# Patient Record
Sex: Male | Born: 2005 | Race: White | Hispanic: No | Marital: Single | Smoking: Never smoker
Health system: Southern US, Community
[De-identification: ages and names within clinical notes are randomized; demographics above are authoritative.]

## PROBLEM LIST (undated history)

## (undated) DIAGNOSIS — R51 Headache: Secondary | ICD-10-CM

## (undated) DIAGNOSIS — E669 Obesity, unspecified: Secondary | ICD-10-CM

## (undated) DIAGNOSIS — R519 Headache, unspecified: Secondary | ICD-10-CM

## (undated) DIAGNOSIS — R198 Other specified symptoms and signs involving the digestive system and abdomen: Secondary | ICD-10-CM

---
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
## 2023-02-04 ENCOUNTER — Encounter: Payer: Self-pay | Admitting: Adult Health

## 2023-02-04 ENCOUNTER — Ambulatory Visit (INDEPENDENT_AMBULATORY_CARE_PROVIDER_SITE_OTHER): Payer: Self-pay | Admitting: Adult Health

## 2023-02-04 DIAGNOSIS — F411 Generalized anxiety disorder: Secondary | ICD-10-CM

## 2023-02-04 DIAGNOSIS — F902 Attention-deficit hyperactivity disorder, combined type: Secondary | ICD-10-CM

## 2023-02-04 DIAGNOSIS — F341 Dysthymic disorder: Secondary | ICD-10-CM

## 2023-02-04 MED ORDER — AMPHETAMINE-DEXTROAMPHET ER 25 MG PO CP24: 25.0000 mg | ORAL_CAPSULE | ORAL | 0 refills | Status: DC

## 2023-02-04 MED ORDER — AMPHETAMINE-DEXTROAMPHET ER 25 MG PO CP24: 25.0000 mg | ORAL_CAPSULE | Freq: Every day | ORAL | 0 refills | Status: DC

## 2023-02-04 NOTE — Addendum Note (Signed)
Addended by: Aloha Gell on: 02/04/2023 05:17 PM   Modules accepted: Level of Service

## 2023-02-04 NOTE — Progress Notes (Signed)
Eugene Boyd [id number] 07-Dec-2006 17 y.o.  Virtual Visit via Telephone Note  I connected with pt on 02/04/23 at  5:00 PM EST by telephone and verified that I am speaking with the correct person using two identifiers.   I discussed the limitations, risks, security and privacy concerns of performing an evaluation and management service by telephone and the availability of in person appointments. I also discussed with the patient that there may be a patient responsible charge related to this service. The patient expressed understanding and agreed to proceed.   I discussed the assessment and treatment plan with the patient. The patient was provided an opportunity to ask questions and all were answered. The patient agreed with the plan and demonstrated an understanding of the instructions.   The patient was advised to call back or seek an in-person evaluation if the symptoms worsen or if the condition fails to improve as anticipated.  I provided 15 minutes of non-face-to-face time during this encounter.  The patient was located at home.  The provider was located at [place].   Aloha Gell, NP   Subjective:   Patient ID:  Eugene Boyd is a 17 y.o. (DOB [birth year]) male.  Chief Complaint: No chief complaint on file.   HPI Eugene Boyd presents for follow-up of MDD, GAD and ADHD.  Describes mood today as "ok". Pleasant. Mood symptoms - denies depression. Reports anxiety and irritability - "trying to keep it out". Reports some worry, rumination and over thinking.  Mood is consistent. Stating "I'm doing pretty good". Feels like the Adderall XR 75m works well for him. Improved interest and motivation. Taking medications as prescribed.   Energy levels stable. Active, has a regular exercise routine.  Enjoys some usual interests and activities. Dating. Has a girlfriend. Lives with father. Mother and father divorced. Spending time with family. Appetite adequate. Weight -  185 from 210 pounds - 75". Sleep has improved. Averages 8 hours.  Focus and concentration stable with Adderall. Completing tasks. Managing aspects of household. High school graduate.  Working at [employer] Denies SI or HI.  Denies AH or VH. Denies any self harm.    Review of Systems:  Review of Systems  Musculoskeletal:  Negative for gait problem.  Neurological:  Negative for tremors.  Psychiatric/Behavioral:         Please refer to HPI    Medications: I have reviewed the patient's current medications.  Current Outpatient Medications  Medication Sig Dispense Refill   amphetamine-dextroamphetamine (ADDERALL XR) 25 MG 24 hr capsule Take 1 capsule by mouth daily after breakfast. 30 capsule 0   amphetamine-dextroamphetamine (ADDERALL XR) 25 MG 24 hr capsule Take 1 capsule by mouth every morning. 30 capsule 0   amphetamine-dextroamphetamine (ADDERALL XR) 25 MG 24 hr capsule Take 1 capsule by mouth every morning. 30 capsule 0   No current facility-administered medications for this visit.    Medication Side Effects: None  Allergies:  Allergies  Allergen Reactions   Amoxicillin Rash    Past Medical History:  Diagnosis Date   Gastrointestinal complaints    Headache    Obesity     Family History  Problem Relation Age of Onset   GER disease Mother    Asthma Mother    Allergies Mother    Colon polyps Mother    Hiatal hernia Mother    Colitis Maternal Grandmother    Celiac disease Maternal Grandmother    Epilepsy Father     Social History  Socioeconomic History   Marital status: Single    Spouse name: Not on file   Number of children: Not on file   Years of education: Not on file   Highest education level: Not on file  Occupational History   Not on file  Tobacco Use   Smoking status: Never   Smokeless tobacco: Never  Vaping Use   Vaping Use: Never used  Substance and Sexual Activity   Alcohol use: No   Drug use: No   Sexual activity: Never  Other Topics  Concern   Not on file  Social History Narrative    Pt is in grade 10th [employer], virtual. Lives at home with mother and sister.   Social Determinants of Health   Financial Resource Strain: Not on file  Food Insecurity: Not on file  Transportation Needs: Not on file  Physical Activity: Not on file  Stress: Not on file  Social Connections: Not on file  Intimate Partner Violence: Not on file    Past Medical History, Surgical history, Social history, and Family history were reviewed and updated as appropriate.   Please see review of systems for further details on the patient's review from today.   Objective:   Physical Exam:  There were no vitals taken for this visit.  Physical Exam  Lab Review:     Component Value Date/Time   NA 140 09/15/2017 1212   NA 141 10/30/2014 1123   K 4.5 09/15/2017 1212   K 4.0 10/30/2014 1123   CL 103 09/15/2017 1212   CL 106 10/30/2014 1123   CO2 27 09/15/2017 1212   CO2 28 (H) 10/30/2014 1123   GLUCOSE 84 09/15/2017 1212   GLUCOSE 83 10/30/2014 1123   BUN 10 09/15/2017 1212   BUN 11 10/30/2014 1123   CREATININE 0.61 09/15/2017 1212   CALCIUM 10.2 09/15/2017 1212   CALCIUM 9.4 10/30/2014 1123   PROT 7.3 09/15/2017 1212   PROT 7.9 10/30/2014 1123   ALBUMIN 4.0 10/30/2014 1123   AST 16 09/15/2017 1212   AST 28 10/30/2014 1123   ALT 21 09/15/2017 1212   ALT 33 10/30/2014 1123   ALKPHOS 251 (H) 10/30/2014 1123   BILITOT 0.4 09/15/2017 1212   BILITOT 0.2 10/30/2014 1123       Component Value Date/Time   WBC 6.8 09/15/2017 1212   RBC 4.90 09/15/2017 1212   HGB 13.6 09/15/2017 1212   HCT 40.6 09/15/2017 1212   PLT 355 09/15/2017 1212   MCV 82.9 09/15/2017 1212   MCH 27.8 09/15/2017 1212   MCHC 33.5 09/15/2017 1212   RDW 13.1 09/15/2017 1212   LYMPHSABS 3,448 09/15/2017 1212   EOSABS 143 09/15/2017 1212   BASOSABS 41 09/15/2017 1212    No results found for: "POCLITH", "LITHIUM"   No results found for: "PHENYTOIN",  "PHENOBARB", "VALPROATE", "CBMZ"   .res Assessment: Plan:     Plan:  PDMP reviewed   Adderall XR 83m daily - will call in 3 months for next set of refills.   Set up with therapist - CLanetta Inch RTC 6 months  Patient advised to contact office with any questions, adverse effects, or acute worsening in signs and symptoms.  Discussed potential benefits, risks, and side effects of stimulants with patient to include increased heart rate, palpitations, insomnia, increased anxiety, increased irritability, or decreased appetite.  Instructed patient to contact office if experiencing any significant tolerability issues.  There are no diagnoses linked to this encounter.  Please see After Visit  Summary for patient specific instructions.  Future Appointments  Date Time Provider [place]  02/04/2023  5:00 PM Deslyn Cavenaugh, Berdie Ogren, NP CP-CP None    No orders of the defined types were placed in this encounter.     -------------------------------

## 2023-02-05 ENCOUNTER — Telehealth: Payer: Self-pay | Admitting: Adult Health

## 2023-02-05 NOTE — Telephone Encounter (Signed)
Patient returned call to confirm prescription be sent to [facility] on [facility] in [place].

## 2023-02-05 NOTE — Telephone Encounter (Signed)
Pt's mom originally called  at 3:40p for refill of Adderall.  They don't have '25mg'$ , they have '30mg'$  at [facility] on [facility] [facility] in [place].  Since Mom isn't on DPR, I contacted the pt to confirm he wanted script sent.  No upcoming appts scheduled.

## 2023-02-08 NOTE — Telephone Encounter (Signed)
Filled his usual dose on 2/23.

## 2023-06-08 ENCOUNTER — Other Ambulatory Visit: Payer: Self-pay | Admitting: Adult Health

## 2023-06-08 ENCOUNTER — Telehealth: Payer: Self-pay | Admitting: Adult Health

## 2023-06-08 DIAGNOSIS — F902 Attention-deficit hyperactivity disorder, combined type: Secondary | ICD-10-CM

## 2023-06-08 MED ORDER — AMPHETAMINE-DEXTROAMPHET ER 25 MG PO CP24: 25.0000 mg | ORAL_CAPSULE | ORAL | 0 refills | Status: DC

## 2023-06-08 NOTE — Telephone Encounter (Signed)
Mom called and said that Eugene Boyd is out of his adderall xr 25 mg.  The pharmacy it needs to be sent to is the [facility] [address] in [place]. There other pharmacy closed down. He is out and is going through withdrawal. Next appt in august

## 2023-06-08 NOTE — Telephone Encounter (Signed)
Script sent  

## 2023-08-03 ENCOUNTER — Telehealth: Payer: Self-pay | Admitting: Adult Health

## 2023-08-03 ENCOUNTER — Other Ambulatory Visit: Payer: Self-pay

## 2023-08-03 DIAGNOSIS — F902 Attention-deficit hyperactivity disorder, combined type: Secondary | ICD-10-CM

## 2023-08-03 MED ORDER — AMPHETAMINE-DEXTROAMPHET ER 25 MG PO CP24: 25.0000 mg | ORAL_CAPSULE | ORAL | 0 refills | Status: DC

## 2023-08-03 NOTE — Telephone Encounter (Signed)
Pended.

## 2023-08-03 NOTE — Telephone Encounter (Signed)
Pt's mother called in at 2:42pm.  He will need a RF on Adderall XR 25mg  before his next appt - 8/28/ Please send in RF to: [id number] - [place], [place] - [address] AT [facility] OF [address]

## 2023-08-11 ENCOUNTER — Ambulatory Visit (INDEPENDENT_AMBULATORY_CARE_PROVIDER_SITE_OTHER): Payer: Self-pay | Admitting: Adult Health

## 2023-08-11 DIAGNOSIS — Z0389 Encounter for observation for other suspected diseases and conditions ruled out: Secondary | ICD-10-CM

## 2023-08-11 NOTE — Progress Notes (Deleted)
Eugene Boyd [id number] 11-12-2006 17 y.o.  Subjective:   Patient ID:  Eugene Boyd is a 17 y.o. (DOB 05-Mar-2006) male.  Chief Complaint: No chief complaint on file.   HPI DAESHON KEAN presents to the office today for follow-up of MDD, GAD and ADHD.  Describes mood today as "ok". Pleasant. Mood symptoms - denies depression. Reports anxiety and irritability - "trying to keep it out". Reports some worry, rumination and over thinking.  Mood is consistent. Stating "I'm doing pretty good". Feels like the Adderall XR 25mg  works well for him. Improved interest and motivation. Taking medications as prescribed.   Energy levels stable. Active, has a regular exercise routine.  Enjoys some usual interests and activities. Dating. Has a girlfriend. Lives with father. Mother and father divorced. Spending time with family. Appetite adequate. Weight - 185 from 210 pounds - 75". Sleep has improved. Averages 8 hours.  Focus and concentration stable with Adderall. Completing tasks. Managing aspects of household. High school graduate.  Working at [employer]. Denies SI or HI.  Denies AH or VH. Denies any self harm.      Review of Systems:  Review of Systems  Medications: {medication reviewed/display:3041432}  Current Outpatient Medications  Medication Sig Dispense Refill   amphetamine-dextroamphetamine (ADDERALL XR) 25 MG 24 hr capsule Take 1 capsule by mouth daily after breakfast. 30 capsule 0   amphetamine-dextroamphetamine (ADDERALL XR) 25 MG 24 hr capsule Take 1 capsule by mouth every morning. 30 capsule 0   amphetamine-dextroamphetamine (ADDERALL XR) 25 MG 24 hr capsule Take 1 capsule by mouth every morning. 30 capsule 0   No current facility-administered medications for this visit.    Medication Side Effects: {Medication Side Effects (Optional):21014029}  Allergies:  Allergies  Allergen Reactions   Amoxicillin Rash    Past Medical History:  Diagnosis Date   Gastrointestinal  complaints    Headache    Obesity     Past Medical History, Surgical history, Social history, and Family history were reviewed and updated as appropriate.   Please see review of systems for further details on the patient's review from today.   Objective:   Physical Exam:  There were no vitals taken for this visit.  Physical Exam  Lab Review:     Component Value Date/Time   NA 140 09/15/2017 1212   NA 141 10/30/2014 1123   K 4.5 09/15/2017 1212   K 4.0 10/30/2014 1123   CL 103 09/15/2017 1212   CL 106 10/30/2014 1123   CO2 27 09/15/2017 1212   CO2 28 (H) 10/30/2014 1123   GLUCOSE 84 09/15/2017 1212   GLUCOSE 83 10/30/2014 1123   BUN 10 09/15/2017 1212   BUN 11 10/30/2014 1123   CREATININE 0.61 09/15/2017 1212   CALCIUM 10.2 09/15/2017 1212   CALCIUM 9.4 10/30/2014 1123   PROT 7.3 09/15/2017 1212   PROT 7.9 10/30/2014 1123   ALBUMIN 4.0 10/30/2014 1123   AST 16 09/15/2017 1212   AST 28 10/30/2014 1123   ALT 21 09/15/2017 1212   ALT 33 10/30/2014 1123   ALKPHOS 251 (H) 10/30/2014 1123   BILITOT 0.4 09/15/2017 1212   BILITOT 0.2 10/30/2014 1123       Component Value Date/Time   WBC 6.8 09/15/2017 1212   RBC 4.90 09/15/2017 1212   HGB 13.6 09/15/2017 1212   HCT 40.6 09/15/2017 1212   PLT 355 09/15/2017 1212   MCV 82.9 09/15/2017 1212   MCH 27.8 09/15/2017 1212   MCHC 33.5 09/15/2017  1212   RDW 13.1 09/15/2017 1212   LYMPHSABS 3,448 09/15/2017 1212   EOSABS 143 09/15/2017 1212   BASOSABS 41 09/15/2017 1212    No results found for: "POCLITH", "LITHIUM"   No results found for: "PHENYTOIN", "PHENOBARB", "VALPROATE", "CBMZ"   .res Assessment: Plan:    Plan:  PDMP reviewed   Adderall XR 25mg  daily - will call in 3 months for next set of refills.   Set up with therapist - Elio Forget  RTC 6 months  Patient advised to contact office with any questions, adverse effects, or acute worsening in signs and symptoms.  Discussed potential benefits, risks,  and side effects of stimulants with patient to include increased heart rate, palpitations, insomnia, increased anxiety, increased irritability, or decreased appetite.  Instructed patient to contact office if experiencing any significant tolerability issues. There are no diagnoses linked to this encounter.   Please see After Visit Summary for patient specific instructions.  Future Appointments  Date Time Provider Department Center  08/11/2023  5:20 PM Maycee Blasco, Thereasa Solo, NP CP-CP None    No orders of the defined types were placed in this encounter.   -------------------------------

## 2023-08-11 NOTE — Progress Notes (Signed)
Patient no show appointment. ? ?

## 2023-09-15 ENCOUNTER — Telehealth: Payer: Self-pay | Admitting: Adult Health

## 2023-09-15 NOTE — Telephone Encounter (Signed)
Mom, Liborio Nixon, called at 10:58 and LM to request refill of Eugene Boyd's Adderall.  We will call her back for appt.  Missed the last appt. He has taken his last pill.  She did not leave pharmacy info.

## 2023-09-15 NOTE — Telephone Encounter (Signed)
Patient has not been compliant with FU. Needs a scheduled appt and we will send in Rx to get to appt.

## 2023-09-16 NOTE — Telephone Encounter (Signed)
LM with Mom to call for appt.  Almira Coaster ok'ed it can be a telephone or virtual appt.

## 2023-09-17 ENCOUNTER — Telehealth: Payer: Self-pay | Admitting: Adult Health

## 2023-09-17 NOTE — Telephone Encounter (Signed)
Mom called and made an appt for 10/7. He needs [facility]all sent to [facility] [address] in [place]

## 2023-09-19 NOTE — Telephone Encounter (Signed)
Has appt 10/7.

## 2023-09-20 ENCOUNTER — Other Ambulatory Visit: Payer: Self-pay

## 2023-09-20 ENCOUNTER — Telehealth: Payer: Self-pay | Admitting: Adult Health

## 2023-09-20 DIAGNOSIS — F902 Attention-deficit hyperactivity disorder, combined type: Secondary | ICD-10-CM

## 2023-09-20 MED ORDER — AMPHETAMINE-DEXTROAMPHET ER 25 MG PO CP24: 25.0000 mg | ORAL_CAPSULE | ORAL | 0 refills | Status: DC

## 2023-09-20 NOTE — Telephone Encounter (Signed)
Pended to Dr. Jennelle Human in Gina's absence.  Due to past due for FU and a no show only pended enough to get to appt.

## 2023-09-20 NOTE — Telephone Encounter (Signed)
Pt was scheduled to have today with Almira Coaster, but since she's out sick, pt is requesting to have Adderall XR 25 mg sent to  [facility] #[id number] [place], [place] - [address] AT [facility] OF [address] [facility] [address] [place], [place] [address] Phone: [phone]  Fax: [phone]   Pt has been out of meds since 10/2 and is starting to have withdrawals.  Pls send asap.  Next appt 10/11

## 2023-09-24 ENCOUNTER — Telehealth: Payer: BC Managed Care – PPO | Admitting: Adult Health

## 2023-09-24 ENCOUNTER — Encounter: Payer: Self-pay | Admitting: Adult Health

## 2023-09-24 DIAGNOSIS — F411 Generalized anxiety disorder: Secondary | ICD-10-CM | POA: Diagnosis not present

## 2023-09-24 DIAGNOSIS — F341 Dysthymic disorder: Secondary | ICD-10-CM | POA: Diagnosis not present

## 2023-09-24 DIAGNOSIS — F902 Attention-deficit hyperactivity disorder, combined type: Secondary | ICD-10-CM

## 2023-09-24 MED ORDER — AMPHETAMINE-DEXTROAMPHET ER 25 MG PO CP24: 25.0000 mg | ORAL_CAPSULE | ORAL | 0 refills | Status: DC

## 2023-09-24 MED ORDER — AMPHETAMINE-DEXTROAMPHET ER 25 MG PO CP24: 25.0000 mg | ORAL_CAPSULE | Freq: Every day | ORAL | 0 refills | Status: DC

## 2023-09-24 NOTE — Progress Notes (Signed)
Eugene Boyd [id number] Apr 09, 2006 17 y.o.  Virtual Visit via Video Note  I connected with pt @ on 09/24/23 at  4:00 PM EDT by a video enabled telemedicine application and verified that I am speaking with the correct person using two identifiers.   I discussed the limitations of evaluation and management by telemedicine and the availability of in person appointments. The patient expressed understanding and agreed to proceed.  I discussed the assessment and treatment plan with the patient. The patient was provided an opportunity to ask questions and all were answered. The patient agreed with the plan and demonstrated an understanding of the instructions.   The patient was advised to call back or seek an in-person evaluation if the symptoms worsen or if the condition fails to improve as anticipated.  I provided 15 minutes of non-face-to-face time during this encounter.  The patient was located at home.  The provider was located at [facility].   Dorothyann Gibbs, NP   Subjective:   Patient ID:  Eugene Boyd is a 17 y.o. (DOB September 15, 2006) male.  Chief Complaint: No chief complaint on file.   HPI Eugene Boyd presents for follow-up of MDD, GAD and ADHD.  Describes mood today as "ok". Pleasant. Mood symptoms - reports some situational depression. Reports anxiety and irritability - "more just life". Denies panic attacks. Reports some worry, rumination and over thinking. Mood is variable. Stating "I think I'm doing alright for now". Feels like the Adderall XR 25mg  works well for him. Improved interest and motivation. Taking medications as prescribed.   Energy levels stable. Active, does not have a regular exercise routine.  Enjoys some usual interests and activities. Single. Lives with father. Mother and father divorced. Spending time with family. Appetite adequate. Weight - 185 pounds - 75". Sleep has improved. Averages 5 to 6 hours during work days and longer on days off.   Focus and concentration stable with Adderall. Completing tasks. Managing aspects of household. High school graduate. Working at [employer]. Denies SI or HI.  Denies AH or VH. Denies any self harm. Denies substance use.   Review of Systems:  Review of Systems  Musculoskeletal:  Negative for gait problem.  Neurological:  Negative for tremors.  Psychiatric/Behavioral:         Please refer to HPI    Medications: I have reviewed the patient's current medications.  Current Outpatient Medications  Medication Sig Dispense Refill   amphetamine-dextroamphetamine (ADDERALL XR) 25 MG 24 hr capsule Take 1 capsule by mouth daily after breakfast. 30 capsule 0   [START ON 10/22/2023] amphetamine-dextroamphetamine (ADDERALL XR) 25 MG 24 hr capsule Take 1 capsule by mouth every morning. 30 capsule 0   [START ON 11/19/2023] amphetamine-dextroamphetamine (ADDERALL XR) 25 MG 24 hr capsule Take 1 capsule by mouth every morning. 30 capsule 0   No current facility-administered medications for this visit.    Medication Side Effects: None  Allergies:  Allergies  Allergen Reactions   Amoxicillin Rash    Past Medical History:  Diagnosis Date   Gastrointestinal complaints    Headache    Obesity     Family History  Problem Relation Age of Onset   GER disease Mother    Asthma Mother    Allergies Mother    Colon polyps Mother    Hiatal hernia Mother    Colitis Maternal Grandmother    Celiac disease Maternal Grandmother    Epilepsy Father     Social History   Socioeconomic History  Marital status: Single    Spouse name: Not on file   Number of children: Not on file   Years of education: Not on file   Highest education level: Not on file  Occupational History   Not on file  Tobacco Use   Smoking status: Never   Smokeless tobacco: Never  Vaping Use   Vaping status: Never Used  Substance and Sexual Activity   Alcohol use: No   Drug use: No   Sexual activity: Never  Other Topics  Concern   Not on file  Social History Narrative    Pt is in grade 10th [employer], virtual. Lives at home with mother and sister.   Social Determinants of Health   Financial Resource Strain: Not on file  Food Insecurity: Not on file  Transportation Needs: Not on file  Physical Activity: Not on file  Stress: Not on file  Social Connections: Not on file  Intimate Partner Violence: Not on file    Past Medical History, Surgical history, Social history, and Family history were reviewed and updated as appropriate.   Please see review of systems for further details on the patient's review from today.   Objective:   Physical Exam:  There were no vitals taken for this visit.  Physical Exam Constitutional:      General: He is not in acute distress. Musculoskeletal:        General: No deformity.  Neurological:     Mental Status: He is alert and oriented to person, place, and time.     Coordination: Coordination normal.  Psychiatric:        Attention and Perception: Attention and perception normal. He does not perceive auditory or visual hallucinations.        Mood and Affect: Affect is not labile, blunt, angry or inappropriate.        Speech: Speech normal.        Behavior: Behavior normal.        Thought Content: Thought content normal. Thought content is not paranoid or delusional. Thought content does not include homicidal or suicidal ideation. Thought content does not include homicidal or suicidal plan.        Cognition and Memory: Cognition and memory normal.        Judgment: Judgment normal.     Comments: Insight intact     Lab Review:     Component Value Date/Time   NA 140 09/15/2017 1212   NA 141 10/30/2014 1123   K 4.5 09/15/2017 1212   K 4.0 10/30/2014 1123   CL 103 09/15/2017 1212   CL 106 10/30/2014 1123   CO2 27 09/15/2017 1212   CO2 28 (H) 10/30/2014 1123   GLUCOSE 84 09/15/2017 1212   GLUCOSE 83 10/30/2014 1123   BUN 10 09/15/2017 1212   BUN 11  10/30/2014 1123   CREATININE 0.61 09/15/2017 1212   CALCIUM 10.2 09/15/2017 1212   CALCIUM 9.4 10/30/2014 1123   PROT 7.3 09/15/2017 1212   PROT 7.9 10/30/2014 1123   ALBUMIN 4.0 10/30/2014 1123   AST 16 09/15/2017 1212   AST 28 10/30/2014 1123   ALT 21 09/15/2017 1212   ALT 33 10/30/2014 1123   ALKPHOS 251 (H) 10/30/2014 1123   BILITOT 0.4 09/15/2017 1212   BILITOT 0.2 10/30/2014 1123       Component Value Date/Time   WBC 6.8 09/15/2017 1212   RBC 4.90 09/15/2017 1212   HGB 13.6 09/15/2017 1212   HCT 40.6 09/15/2017 1212  PLT 355 09/15/2017 1212   MCV 82.9 09/15/2017 1212   MCH 27.8 09/15/2017 1212   MCHC 33.5 09/15/2017 1212   RDW 13.1 09/15/2017 1212   LYMPHSABS 3,448 09/15/2017 1212   EOSABS 143 09/15/2017 1212   BASOSABS 41 09/15/2017 1212    No results found for: "POCLITH", "LITHIUM"   No results found for: "PHENYTOIN", "PHENOBARB", "VALPROATE", "CBMZ"   .res Assessment: Plan:    Plan:  PDMP reviewed   Adderall XR 25mg  daily - will call in 3 months for next set of refills.   Set up with therapist - Elio Forget  RTC 6 months  Patient advised to contact office with any questions, adverse effects, or acute worsening in signs and symptoms.  Discussed potential benefits, risks, and side effects of stimulants with patient to include increased heart rate, palpitations, insomnia, increased anxiety, increased irritability, or decreased appetite.  Instructed patient to contact office if experiencing any significant tolerability issues.  Diagnoses and all orders for this visit:  Attention deficit hyperactivity disorder (ADHD), combined type -     amphetamine-dextroamphetamine (ADDERALL XR) 25 MG 24 hr capsule; Take 1 capsule by mouth daily after breakfast. -     amphetamine-dextroamphetamine (ADDERALL XR) 25 MG 24 hr capsule; Take 1 capsule by mouth every morning. -     amphetamine-dextroamphetamine (ADDERALL XR) 25 MG 24 hr capsule; Take 1 capsule by mouth  every morning.  Persistent depressive disorder with atypical features, currently moderate  Generalized anxiety disorder     Please see After Visit Summary for patient specific instructions.  No future appointments.  No orders of the defined types were placed in this encounter.     -------------------------------

## 2023-09-30 ENCOUNTER — Ambulatory Visit
Admission: EM | Admit: 2023-09-30 | Discharge: 2023-09-30 | Disposition: A | Discharge status: Home / Self Care | Payer: BC Managed Care – PPO | Attending: Physician Assistant | Admitting: Physician Assistant

## 2023-09-30 ENCOUNTER — Ambulatory Visit: Payer: BC Managed Care – PPO

## 2023-09-30 DIAGNOSIS — R079 Chest pain, unspecified: Secondary | ICD-10-CM

## 2023-09-30 DIAGNOSIS — R0981 Nasal congestion: Secondary | ICD-10-CM

## 2023-09-30 DIAGNOSIS — R051 Acute cough: Secondary | ICD-10-CM

## 2023-09-30 DIAGNOSIS — R059 Cough, unspecified: Secondary | ICD-10-CM | POA: Diagnosis not present

## 2023-09-30 MED ORDER — IPRATROPIUM BROMIDE 0.06 % NA SOLN: 2.0000 | Freq: Four times a day (QID) | NASAL | 0 refills | Status: DC

## 2023-09-30 MED ORDER — PROMETHAZINE-DM 6.25-15 MG/5ML PO SYRP: 5.0000 mL | ORAL_SOLUTION | Freq: Four times a day (QID) | ORAL | 0 refills | Status: DC | PRN

## 2023-09-30 NOTE — ED Provider Notes (Signed)
[facility]    CSN: [id number] Arrival date & time: 09/30/23  1412      History   Chief Complaint Chief Complaint  Patient presents with   Cough    HPI Eugene Boyd is a 17 y.o. male presenting for 1 month history of cough and congestion.  Patient reports the cough has become more productive in the past few days. He now has yellowish-green sputum at times and coughing hard hurts his chest.  Denies fever, fatigue, sinus pain, sore throat, shortness of breath, wheezing, abdominal pain, vomiting or diarrhea.  No history of asthma or lung disease.  History of allergies. States he sometimes has these symptoms with seasons change. He became concerned this time since he has developed chest pain. Has not been taking any OTC meds.  HPI  Past Medical History:  Diagnosis Date   Gastrointestinal complaints    Headache    Obesity     Patient Active Problem List   Diagnosis Date Noted   Persistent depressive disorder with atypical features, currently moderate 09/20/2018   Generalized anxiety disorder 09/02/2018   Attention deficit hyperactivity disorder (ADHD), combined type 09/02/2018   IBS (irritable bowel syndrome) 04/04/2018    Past Surgical History:  Procedure Laterality Date   ESOPHAGOGASTRODUODENOSCOPY N/A 01/11/2018   Procedure: ESOPHAGOGASTRODUODENOSCOPY (EGD);  Surgeon: Adelene Amas, MD;  Location: [facility] ENDOSCOPY;  Service: Gastroenterology;  Laterality: N/A;   FLEXIBLE SIGMOIDOSCOPY N/A 01/11/2018   Procedure: FLEXIBLE SIGMOIDOSCOPY;  Surgeon: Adelene Amas, MD;  Location: [facility] ENDOSCOPY;  Service: Gastroenterology;  Laterality: N/A;       Home Medications    Prior to Admission medications   Medication Sig Start Date End Date Taking? Authorizing Provider  amphetamine-dextroamphetamine (ADDERALL XR) 25 MG 24 hr capsule Take 1 capsule by mouth daily after breakfast. 09/24/23 10/24/23 Yes Mozingo, Thereasa Solo, NP  ipratropium (ATROVENT) 0.06 % nasal spray  Place 2 sprays into both nostrils 4 (four) times daily. 09/30/23  Yes Shirlee Latch, PA-C  promethazine-dextromethorphan (PROMETHAZINE-DM) 6.25-15 MG/5ML syrup Take 5 mLs by mouth 4 (four) times daily as needed. 09/30/23  Yes Shirlee Latch, PA-C  amphetamine-dextroamphetamine (ADDERALL XR) 25 MG 24 hr capsule Take 1 capsule by mouth every morning. 10/22/23   Mozingo, Thereasa Solo, NP  amphetamine-dextroamphetamine (ADDERALL XR) 25 MG 24 hr capsule Take 1 capsule by mouth every morning. 11/19/23   Mozingo, Thereasa Solo, NP    Family History Family History  Problem Relation Age of Onset   GER disease Mother    Asthma Mother    Allergies Mother    Colon polyps Mother    Hiatal hernia Mother    Colitis Maternal Grandmother    Celiac disease Maternal Grandmother    Epilepsy Father     Social History Social History   Tobacco Use   Smoking status: Never   Smokeless tobacco: Never  Vaping Use   Vaping status: Never Used  Substance Use Topics   Alcohol use: No   Drug use: No     Allergies   Amoxicillin   Review of Systems Review of Systems  Constitutional:  Negative for fatigue and fever.  HENT:  Positive for congestion and rhinorrhea. Negative for sinus pressure, sinus pain and sore throat.   Respiratory:  Positive for cough. Negative for shortness of breath.   Cardiovascular:  Positive for chest pain.  Gastrointestinal:  Negative for abdominal pain, diarrhea, nausea and vomiting.  Musculoskeletal:  Positive for back pain. Negative for myalgias.  Allergic/Immunologic: Positive  for environmental allergies.  Neurological:  Negative for weakness, light-headedness and headaches.  Hematological:  Negative for adenopathy.     Physical Exam Triage Vital Signs ED Triage Vitals [09/30/23 1422]  Encounter Vitals Group     BP (!) 146/76     Systolic BP Percentile      Diastolic BP Percentile      Pulse Rate (!) 107     Resp 18     Temp 98.7 F (37.1 C)     Temp  Source Oral     SpO2 97 %     Weight      Height      Head Circumference      Peak Flow      Pain Score 6     Pain Loc      Pain Education      Exclude from Growth Chart    No data found.  Updated Vital Signs BP (!) 146/76 (BP Location: Right Arm)   Pulse (!) 107   Temp 98.7 F (37.1 C) (Oral)   Resp 18   SpO2 97%     Physical Exam Vitals and nursing note reviewed.  Constitutional:      General: He is not in acute distress.    Appearance: Normal appearance. He is well-developed. He is not ill-appearing.  HENT:     Head: Normocephalic and atraumatic.     Nose: Congestion present.     Mouth/Throat:     Mouth: Mucous membranes are moist.     Pharynx: Oropharynx is clear.  Eyes:     General: No scleral icterus.    Conjunctiva/sclera: Conjunctivae normal.  Cardiovascular:     Rate and Rhythm: Regular rhythm. Tachycardia present.     Heart sounds: Normal heart sounds.  Pulmonary:     Effort: Pulmonary effort is normal. No respiratory distress.     Breath sounds: Normal breath sounds. No wheezing, rhonchi or rales.  Musculoskeletal:     Cervical back: Neck supple.  Skin:    General: Skin is warm and dry.     Capillary Refill: Capillary refill takes less than 2 seconds.  Neurological:     General: No focal deficit present.     Mental Status: He is alert. Mental status is at baseline.     Motor: No weakness.     Gait: Gait normal.  Psychiatric:        Mood and Affect: Mood normal.        Behavior: Behavior normal.      UC Treatments / Results  Labs (all labs ordered are listed, but only abnormal results are displayed) Labs Reviewed - No data to display  EKG   Radiology No results found.  Procedures Procedures (including critical care time)  Medications Ordered in UC Medications - No data to display  Initial Impression / Assessment and Plan / UC Course  I have reviewed the triage vital signs and the nursing notes.  Pertinent labs & imaging results  that were available during my care of the patient were reviewed by me and considered in my medical decision making (see chart for details).   17 year old male presents for 1 month history of cough and congestion.  Reports recent worsening of symptoms.  Now reporting cough is productive and he has chest pain and back pain.  No reported fever or shortness of breath. History of allergies. Not taking any OTC meds.  He has tachycardic at 107 bpm.  He is afebrile.  Overall  well-appearing.  No acute distress.  Patient has mild nasal congestion.  Chest is clear.  Heart regular rhythm.  Chest x-ray obtained to assess for possible pneumonia given duration of symptoms.  Wet read of chest x-ray without acute abnormality.  Discussed this with patient.  Reviewed the symptoms most likely consistent with allergies and/or viral infection.  Supportive care encouraged.  I sent Promethazine DM to pharmacy and also Atrovent nasal spray.  Advised Tylenol, Vicks VapoRub, ibuprofen.  If there is evidence of pneumonia on the chest x-ray as read by radiologist I will send antibiotics.  He should return if he has fever, worsening cough, increased shortness of breath or weakness.  Radiology overread is negative.   Final Clinical Impressions(s) / UC Diagnoses   Final diagnoses:  Acute cough  Nasal congestion  Chest pain, unspecified type     Discharge Instructions      -We obtained a chest x-ray.  I reviewed the x-ray and did not see any evidence of pneumonia.  A radiologist will review the chest x-ray but the report will take a couple of hours to come back.  The report will come through MyChart but I will call you if anything is read as abnormal such as pneumonia requiring antibiotics. - Symptoms likely related to allergies.  I sent a cough medication to the pharmacy for you.  It contains an antihistamine and a cough suppressant.  I also sent a nasal spray.  Increase rest and fluids. - You should be seen again if you  develop a fever or have increased shortness of breath or worsening chest pain. - May take Tylenol and use Vicks VapoRub on your chest. -During allergy season, consider taking a daily antihistamine such as Zyrtec or Claritin.     ED Prescriptions     Medication Sig Dispense Auth. Provider   promethazine-dextromethorphan (PROMETHAZINE-DM) 6.25-15 MG/5ML syrup Take 5 mLs by mouth 4 (four) times daily as needed. 118 mL Eusebio Friendly B, PA-C   ipratropium (ATROVENT) 0.06 % nasal spray Place 2 sprays into both nostrils 4 (four) times daily. 15 mL Shirlee Latch, PA-C      PDMP not reviewed this encounter.   Shirlee Latch, PA-C 09/30/23 1630

## 2023-09-30 NOTE — ED Triage Notes (Signed)
Patient states that he has had a cough x 30 days. Starting to hurt his chest and back and becoming productive.

## 2023-09-30 NOTE — Discharge Instructions (Addendum)
-  We obtained a chest x-ray.  I reviewed the x-ray and did not see any evidence of pneumonia.  A radiologist will review the chest x-ray but the report will take a couple of hours to come back.  The report will come through MyChart but I will call you if anything is read as abnormal such as pneumonia requiring antibiotics. - Symptoms likely related to allergies.  I sent a cough medication to the pharmacy for you.  It contains an antihistamine and a cough suppressant.  I also sent a nasal spray.  Increase rest and fluids. - You should be seen again if you develop a fever or have increased shortness of breath or worsening chest pain. - May take Tylenol and use Vicks VapoRub on your chest. -During allergy season, consider taking a daily antihistamine such as Zyrtec or Claritin.

## 2023-11-19 ENCOUNTER — Ambulatory Visit
Admission: EM | Admit: 2023-11-19 | Discharge: 2023-11-19 | Disposition: A | Discharge status: Home / Self Care | Payer: BC Managed Care – PPO | Attending: Physician Assistant | Admitting: Physician Assistant

## 2023-11-19 ENCOUNTER — Ambulatory Visit: Payer: BC Managed Care – PPO

## 2023-11-19 ENCOUNTER — Encounter: Payer: Self-pay | Admitting: Emergency Medicine

## 2023-11-19 DIAGNOSIS — R0781 Pleurodynia: Secondary | ICD-10-CM

## 2023-11-19 DIAGNOSIS — J22 Unspecified acute lower respiratory infection: Secondary | ICD-10-CM

## 2023-11-19 DIAGNOSIS — R051 Acute cough: Secondary | ICD-10-CM | POA: Diagnosis not present

## 2023-11-19 DIAGNOSIS — R059 Cough, unspecified: Secondary | ICD-10-CM | POA: Diagnosis not present

## 2023-11-19 MED ORDER — DOXYCYCLINE HYCLATE 100 MG PO CAPS: 100.0000 mg | ORAL_CAPSULE | Freq: Two times a day (BID) | ORAL | 0 refills | Status: AC

## 2023-11-19 MED ORDER — NAPROXEN 500 MG PO TABS: 500.0000 mg | ORAL_TABLET | Freq: Two times a day (BID) | ORAL | 0 refills | Status: AC | PRN

## 2023-11-19 MED ORDER — PROMETHAZINE-DM 6.25-15 MG/5ML PO SYRP
5.0000 mL | ORAL_SOLUTION | Freq: Four times a day (QID) | ORAL | 0 refills | Status: DC | PRN
Start: 2023-11-19 — End: 2024-07-18

## 2023-11-19 NOTE — Discharge Instructions (Addendum)
-  There could be a little infection of the right lower lung field.  I have sent antibiotic to the pharmacy but we will call you with official radiology report if the radiologist reads it is abnormal. - I also sent cough medicine and anti inflammatory medicine. Increase rest and fluids. - You need to be seen again if you develop a fever or have shortness of breath.

## 2023-11-19 NOTE — ED Provider Notes (Signed)
[facility]    CSN: [id number] Arrival date & time: 11/19/23  1128      History   Chief Complaint Chief Complaint  Patient presents with   Cough   Abdominal Pain    HPI Eugene Boyd is a 17 y.o. male presenting for 1 month history of cough and congestion.  Patient reports the cough has become more productive in the past few days. He now has yellowish-green sputum at times and coughing hard hurts his chest.  He states he has been having right sided rib pain for the past few days. Reports increased pain with breathing and coughing. Also has increased pain with position change from laying to sitting/standing.  Denies injury but says he sometimes wrestles his friends, does jujitsu and upper body workouts.  Denies fever, fatigue, sinus pain, sore throat, shortness of breath, abdominal pain, vomiting or diarrhea. Reports similar symptoms a couple of months ago which resolved until they returned over the past 1 month.  No history of asthma or lung disease.  History of allergies. States he sometimes has these symptoms with seasons change. He became concerned this time since he has developed chest pain. Has not been taking any OTC meds.   HPI  Past Medical History:  Diagnosis Date   Gastrointestinal complaints    Headache    Obesity     Patient Active Problem List   Diagnosis Date Noted   Persistent depressive disorder with atypical features, currently moderate 09/20/2018   Generalized anxiety disorder 09/02/2018   Attention deficit hyperactivity disorder (ADHD), combined type 09/02/2018   IBS (irritable bowel syndrome) 04/04/2018    Past Surgical History:  Procedure Laterality Date   ESOPHAGOGASTRODUODENOSCOPY N/A 01/11/2018   Procedure: ESOPHAGOGASTRODUODENOSCOPY (EGD);  Surgeon: Adelene Amas, MD;  Location: [facility] ENDOSCOPY;  Service: Gastroenterology;  Laterality: N/A;   FLEXIBLE SIGMOIDOSCOPY N/A 01/11/2018   Procedure: FLEXIBLE SIGMOIDOSCOPY;  Surgeon: Adelene Amas,  MD;  Location: [facility] ENDOSCOPY;  Service: Gastroenterology;  Laterality: N/A;       Home Medications    Prior to Admission medications   Medication Sig Start Date End Date Taking? Authorizing Provider  amphetamine-dextroamphetamine (ADDERALL XR) 25 MG 24 hr capsule Take 1 capsule by mouth every morning. 10/22/23  Yes Mozingo, Thereasa Solo, NP  doxycycline (VIBRAMYCIN) 100 MG capsule Take 1 capsule (100 mg total) by mouth 2 (two) times daily for 7 days. 11/19/23 11/26/23 Yes Eusebio Friendly B, PA-C  naproxen (NAPROSYN) 500 MG tablet Take 1 tablet (500 mg total) by mouth 2 (two) times daily as needed for moderate pain (pain score 4-6). 11/19/23  Yes Shirlee Latch, PA-C  promethazine-dextromethorphan (PROMETHAZINE-DM) 6.25-15 MG/5ML syrup Take 5 mLs by mouth 4 (four) times daily as needed. 11/19/23  Yes Shirlee Latch, PA-C  amphetamine-dextroamphetamine (ADDERALL XR) 25 MG 24 hr capsule Take 1 capsule by mouth daily after breakfast. 09/24/23 10/24/23  Mozingo, Thereasa Solo, NP  amphetamine-dextroamphetamine (ADDERALL XR) 25 MG 24 hr capsule Take 1 capsule by mouth every morning. 11/19/23   Mozingo, Thereasa Solo, NP    Family History Family History  Problem Relation Age of Onset   GER disease Mother    Asthma Mother    Allergies Mother    Colon polyps Mother    Hiatal hernia Mother    Colitis Maternal Grandmother    Celiac disease Maternal Grandmother    Epilepsy Father     Social History Social History   Tobacco Use   Smoking status: Never   Smokeless tobacco:  Never  Vaping Use   Vaping status: Never Used  Substance Use Topics   Alcohol use: No   Drug use: No     Allergies   Amoxicillin   Review of Systems Review of Systems  Constitutional:  Negative for fatigue and fever.  HENT:  Positive for congestion. Negative for rhinorrhea, sinus pressure, sinus pain and sore throat.   Respiratory:  Positive for cough. Negative for shortness of breath.   Cardiovascular:   Positive for chest pain.  Gastrointestinal:  Negative for abdominal pain, diarrhea, nausea and vomiting.  Musculoskeletal:  Negative for back pain and myalgias.  Allergic/Immunologic: Positive for environmental allergies.  Neurological:  Negative for weakness, light-headedness and headaches.  Hematological:  Negative for adenopathy.     Physical Exam Triage Vital Signs  No data found.  Updated Vital Signs BP (!) 141/83 (BP Location: Right Arm)   Pulse 99   Temp 97.6 F (36.4 C) (Oral)   Resp 15   Ht 6' 3.5" (1.918 m)   Wt 267 lb 3.2 oz (121.2 kg)   SpO2 98%   BMI 32.96 kg/m     Physical Exam Vitals and nursing note reviewed.  Constitutional:      General: He is not in acute distress.    Appearance: Normal appearance. He is well-developed. He is not ill-appearing.  HENT:     Head: Normocephalic and atraumatic.     Nose: Congestion present.     Mouth/Throat:     Mouth: Mucous membranes are moist.     Pharynx: Oropharynx is clear.  Eyes:     General: No scleral icterus.    Conjunctiva/sclera: Conjunctivae normal.  Cardiovascular:     Rate and Rhythm: Regular rhythm. Tachycardia present.     Heart sounds: Normal heart sounds.  Pulmonary:     Effort: Pulmonary effort is normal. No respiratory distress.     Breath sounds: Normal breath sounds. No wheezing, rhonchi or rales.  Chest:     Chest wall: Tenderness (right lateral ribs) present.  Abdominal:     Tenderness: There is no abdominal tenderness.  Musculoskeletal:     Cervical back: Neck supple.  Skin:    General: Skin is warm and dry.     Capillary Refill: Capillary refill takes less than 2 seconds.  Neurological:     General: No focal deficit present.     Mental Status: He is alert. Mental status is at baseline.     Motor: No weakness.     Gait: Gait normal.  Psychiatric:        Mood and Affect: Mood normal.        Behavior: Behavior normal.      UC Treatments / Results  Labs (all labs ordered are  listed, but only abnormal results are displayed) Labs Reviewed - No data to display  EKG   Radiology DG Ribs Unilateral W/Chest Right  Result Date: 11/19/2023 CLINICAL DATA:  Cough. EXAM: RIGHT RIBS AND CHEST - 3+ VIEW COMPARISON:  September 30, 2023. FINDINGS: No fracture or other bone lesions are seen involving the ribs. There is no evidence of pneumothorax or pleural effusion. Both lungs are clear. Heart size and mediastinal contours are within normal limits. IMPRESSION: Negative. Electronically Signed   By: Lupita Raider M.D.   On: 11/19/2023 14:22    Procedures Procedures (including critical care time)  Medications Ordered in UC Medications - No data to display  Initial Impression / Assessment and Plan / UC Course  I  have reviewed the triage vital signs and the nursing notes.  Pertinent labs & imaging results that were available during my care of the patient were reviewed by me and considered in my medical decision making (see chart for details).   17 year old male presents for 1 month history of cough and congestion.  Reports recent worsening of symptoms.  Now reporting cough is productive and he has right sided rib.  No reported fever or shortness of breath. History of allergies. Not taking any OTC meds.  He is afebrile.  Overall well-appearing.  No acute distress.  Patient has mild nasal congestion.  Chest is clear.  Heart regular rhythm. TTP right lateral ribs.  Right rib/chest x-ray obtained to assess for possible pneumonia given duration of symptoms. Also ruling out pneumothorax and ribs fracture or effusion.   Wet read of chest x-ray with possible right lower lobe pneumonia but no definite acute abnormality.  Discussed this with patient.  Reviewed the symptoms most likely consistent with allergies and/or viral infection especially if over read is negative.  Since he has been ill for the past 1 month and I have concerns about early pneumonia I have sent doxycycline to pharmacy.   Additionally encouraged supportive care and sent Promethazine DM and naproxen to pharmacy for cough and rib pain. Advised Tylenol, Vicks VapoRub.He should return if he has fever, worsening cough, increased shortness of breath or weakness.  Radiology overread is negative.   Final Clinical Impressions(s) / UC Diagnoses   Final diagnoses:  Rib pain on right side  Acute cough  Lower resp. tract infection     Discharge Instructions      -There could be a little infection of the right lower lung field.  I have sent antibiotic to the pharmacy but we will call you with official radiology report if the radiologist reads it is abnormal. - I also sent cough medicine and anti inflammatory medicine. Increase rest and fluids. - You need to be seen again if you develop a fever or have shortness of breath.      ED Prescriptions     Medication Sig Dispense Auth. Provider   doxycycline (VIBRAMYCIN) 100 MG capsule Take 1 capsule (100 mg total) by mouth 2 (two) times daily for 7 days. 14 capsule Shirlee Latch, PA-C   promethazine-dextromethorphan (PROMETHAZINE-DM) 6.25-15 MG/5ML syrup Take 5 mLs by mouth 4 (four) times daily as needed. 118 mL Eusebio Friendly B, PA-C   naproxen (NAPROSYN) 500 MG tablet Take 1 tablet (500 mg total) by mouth 2 (two) times daily as needed for moderate pain (pain score 4-6). 30 tablet Gareth Morgan      PDMP not reviewed this encounter.      Shirlee Latch, PA-C 11/19/23 [phone]

## 2023-11-19 NOTE — ED Triage Notes (Signed)
Patient reports cough and chest congestion for couple of weeks.  Patient reports right side and abdominal pain that started 3 days ago.  Patient denies fevers.

## 2024-02-10 ENCOUNTER — Other Ambulatory Visit: Payer: Self-pay

## 2024-02-10 ENCOUNTER — Telehealth: Payer: Self-pay | Admitting: Adult Health

## 2024-02-10 DIAGNOSIS — F902 Attention-deficit hyperactivity disorder, combined type: Secondary | ICD-10-CM

## 2024-02-10 NOTE — Telephone Encounter (Signed)
 Pended.

## 2024-02-10 NOTE — Telephone Encounter (Signed)
 Pt's mom called at 3:10p. She is on DPR.  Pt is requesting refill of Adderall to   [id number] Cheree Ditto, [place] - [address] AT [facility] OF [address] [facility] [address] [place], [place] [address] Phone: [phone]  Fax: [phone]   Next appt 3/6

## 2024-02-11 MED ORDER — AMPHETAMINE-DEXTROAMPHET ER 25 MG PO CP24: 25.0000 mg | ORAL_CAPSULE | Freq: Every day | ORAL | 0 refills | Status: DC

## 2024-02-17 ENCOUNTER — Encounter: Payer: Self-pay | Admitting: Adult Health

## 2024-02-17 ENCOUNTER — Telehealth (INDEPENDENT_AMBULATORY_CARE_PROVIDER_SITE_OTHER): Payer: BC Managed Care – PPO | Admitting: Adult Health

## 2024-02-17 DIAGNOSIS — Z0389 Encounter for observation for other suspected diseases and conditions ruled out: Secondary | ICD-10-CM

## 2024-02-17 NOTE — Progress Notes (Deleted)
 Eugene Boyd [id number] February 26, 2006 18 y.o.  Virtual Visit via Video Note  I connected with pt @ on 02/17/24 at 12:00 PM EST by a video enabled telemedicine application and verified that I am speaking with the correct person using two identifiers.   I discussed the limitations of evaluation and management by telemedicine and the availability of in person appointments. The patient expressed understanding and agreed to proceed.  I discussed the assessment and treatment plan with the patient. The patient was provided an opportunity to ask questions and all were answered. The patient agreed with the plan and demonstrated an understanding of the instructions.   The patient was advised to call back or seek an in-person evaluation if the symptoms worsen or if the condition fails to improve as anticipated.  I provided *** minutes of non-face-to-face time during this encounter.  The patient was located at home.  The provider was located at [facility].   Dorothyann Gibbs, NP   Subjective:   Patient ID:  Eugene Boyd is a 18 y.o. (DOB 12/06/2006) male.  Chief Complaint: No chief complaint on file.   HPI Eugene Boyd presents for follow-up of ***   Review of Systems:  Review of Systems  Medications: {medication reviewed/display:3041432}  Current Outpatient Medications  Medication Sig Dispense Refill   amphetamine-dextroamphetamine (ADDERALL XR) 25 MG 24 hr capsule Take 1 capsule by mouth every morning. 30 capsule 0   amphetamine-dextroamphetamine (ADDERALL XR) 25 MG 24 hr capsule Take 1 capsule by mouth every morning. 30 capsule 0   amphetamine-dextroamphetamine (ADDERALL XR) 25 MG 24 hr capsule Take 1 capsule by mouth daily after breakfast. 30 capsule 0   naproxen (NAPROSYN) 500 MG tablet Take 1 tablet (500 mg total) by mouth 2 (two) times daily as needed for moderate pain (pain score 4-6). 30 tablet 0   promethazine-dextromethorphan (PROMETHAZINE-DM) 6.25-15 MG/5ML syrup  Take 5 mLs by mouth 4 (four) times daily as needed. 118 mL 0   No current facility-administered medications for this visit.    Medication Side Effects: {Medication Side Effects (Optional):21014029}  Allergies:  Allergies  Allergen Reactions   Amoxicillin Rash    Past Medical History:  Diagnosis Date   Gastrointestinal complaints    Headache    Obesity     Family History  Problem Relation Age of Onset   GER disease Mother    Asthma Mother    Allergies Mother    Colon polyps Mother    Hiatal hernia Mother    Colitis Maternal Grandmother    Celiac disease Maternal Grandmother    Epilepsy Father     Social History   Socioeconomic History   Marital status: Single    Spouse name: Not on file   Number of children: Not on file   Years of education: Not on file   Highest education level: Not on file  Occupational History   Not on file  Tobacco Use   Smoking status: Never   Smokeless tobacco: Never  Vaping Use   Vaping status: Never Used  Substance and Sexual Activity   Alcohol use: No   Drug use: No   Sexual activity: Never  Other Topics Concern   Not on file  Social History Narrative    Pt is in grade 10th [employer], virtual. Lives at home with mother and sister.   Social Drivers of Corporate investment banker Strain: Not on file  Food Insecurity: Not on file  Transportation Needs: Not on file  Physical Activity: Not on file  Stress: Not on file  Social Connections: Not on file  Intimate Partner Violence: Not on file    Past Medical History, Surgical history, Social history, and Family history were reviewed and updated as appropriate.   Please see review of systems for further details on the patient's review from today.   Objective:   Physical Exam:  There were no vitals taken for this visit.  Physical Exam  Lab Review:     Component Value Date/Time   NA 140 09/15/2017 1212   NA 141 10/30/2014 1123   K 4.5 09/15/2017 1212   K 4.0  10/30/2014 1123   CL 103 09/15/2017 1212   CL 106 10/30/2014 1123   CO2 27 09/15/2017 1212   CO2 28 (H) 10/30/2014 1123   GLUCOSE 84 09/15/2017 1212   GLUCOSE 83 10/30/2014 1123   BUN 10 09/15/2017 1212   BUN 11 10/30/2014 1123   CREATININE 0.61 09/15/2017 1212   CALCIUM 10.2 09/15/2017 1212   CALCIUM 9.4 10/30/2014 1123   PROT 7.3 09/15/2017 1212   PROT 7.9 10/30/2014 1123   ALBUMIN 4.0 10/30/2014 1123   AST 16 09/15/2017 1212   AST 28 10/30/2014 1123   ALT 21 09/15/2017 1212   ALT 33 10/30/2014 1123   ALKPHOS 251 (H) 10/30/2014 1123   BILITOT 0.4 09/15/2017 1212   BILITOT 0.2 10/30/2014 1123       Component Value Date/Time   WBC 6.8 09/15/2017 1212   RBC 4.90 09/15/2017 1212   HGB 13.6 09/15/2017 1212   HCT 40.6 09/15/2017 1212   PLT 355 09/15/2017 1212   MCV 82.9 09/15/2017 1212   MCH 27.8 09/15/2017 1212   MCHC 33.5 09/15/2017 1212   RDW 13.1 09/15/2017 1212   LYMPHSABS 3,448 09/15/2017 1212   EOSABS 143 09/15/2017 1212   BASOSABS 41 09/15/2017 1212    No results found for: "POCLITH", "LITHIUM"   No results found for: "PHENYTOIN", "PHENOBARB", "VALPROATE", "CBMZ"   .res Assessment: Plan:    There are no diagnoses linked to this encounter.   Please see After Visit Summary for patient specific instructions.  Future Appointments  Date Time Provider Department Center  02/17/2024 12:00 PM Susen Haskew, Thereasa Solo, NP [facility] None  03/30/2024  8:20 AM Bethanie Dicker, NP [facility]    No orders of the defined types were placed in this encounter.     -------------------------------

## 2024-02-17 NOTE — Progress Notes (Signed)
 Patient no show appointment. Called and LVM to call office and R/S.

## 2024-03-15 ENCOUNTER — Other Ambulatory Visit: Payer: Self-pay

## 2024-03-15 ENCOUNTER — Telehealth: Payer: Self-pay | Admitting: Adult Health

## 2024-03-15 DIAGNOSIS — F902 Attention-deficit hyperactivity disorder, combined type: Secondary | ICD-10-CM

## 2024-03-15 MED ORDER — AMPHETAMINE-DEXTROAMPHET ER 25 MG PO CP24: 25.0000 mg | ORAL_CAPSULE | ORAL | 0 refills | Status: DC

## 2024-03-15 NOTE — Telephone Encounter (Signed)
 Next appt is 04/26/24. Osa is requesting a refill on his Adderall 25 mg called to:  [facility] #[id number] Cheree Ditto, [place] - [address] AT [facility] [address] [facility]   Phone: [phone]  Fax: [phone]

## 2024-03-15 NOTE — Telephone Encounter (Signed)
 Pended.

## 2024-03-30 ENCOUNTER — Ambulatory Visit: Admitting: Nurse Practitioner

## 2024-03-30 ENCOUNTER — Ambulatory Visit: Payer: BC Managed Care – PPO | Admitting: Nurse Practitioner

## 2024-04-26 ENCOUNTER — Telehealth (INDEPENDENT_AMBULATORY_CARE_PROVIDER_SITE_OTHER): Payer: Self-pay | Admitting: Adult Health

## 2024-04-26 DIAGNOSIS — Z0389 Encounter for observation for other suspected diseases and conditions ruled out: Secondary | ICD-10-CM

## 2024-04-26 NOTE — Progress Notes (Signed)
 Patient no show appointment. Called patient - was not aware of appointment and was on his way to work. Will call back to R/S.

## 2024-04-28 ENCOUNTER — Telehealth: Payer: Self-pay | Admitting: Adult Health

## 2024-04-28 NOTE — Telephone Encounter (Signed)
 Pt scheduled an appt for 05/11/24. Hhe needs a refill on his adderall xr 25 mg. Pharmacy is [facility] on [address] in [place]

## 2024-04-30 ENCOUNTER — Other Ambulatory Visit: Payer: Self-pay

## 2024-04-30 DIAGNOSIS — F902 Attention-deficit hyperactivity disorder, combined type: Secondary | ICD-10-CM

## 2024-04-30 MED ORDER — AMPHETAMINE-DEXTROAMPHET ER 25 MG PO CP24: 25.0000 mg | ORAL_CAPSULE | ORAL | 0 refills | Status: DC

## 2024-04-30 NOTE — Telephone Encounter (Signed)
 Pended enough Adderall to appt, has had 2 no shows.

## 2024-05-11 ENCOUNTER — Encounter: Payer: Self-pay | Admitting: Adult Health

## 2024-05-11 ENCOUNTER — Telehealth: Admitting: Adult Health

## 2024-05-11 DIAGNOSIS — F341 Dysthymic disorder: Secondary | ICD-10-CM

## 2024-05-11 DIAGNOSIS — F411 Generalized anxiety disorder: Secondary | ICD-10-CM | POA: Diagnosis not present

## 2024-05-11 DIAGNOSIS — F902 Attention-deficit hyperactivity disorder, combined type: Secondary | ICD-10-CM

## 2024-05-11 MED ORDER — AMPHETAMINE-DEXTROAMPHET ER 25 MG PO CP24: 25.0000 mg | ORAL_CAPSULE | ORAL | 0 refills | Status: DC

## 2024-05-11 MED ORDER — AMPHETAMINE-DEXTROAMPHET ER 25 MG PO CP24
25.0000 mg | ORAL_CAPSULE | ORAL | 0 refills | Status: DC
Start: 2024-06-08 — End: 2024-08-25

## 2024-05-11 MED ORDER — AMPHETAMINE-DEXTROAMPHET ER 25 MG PO CP24: 25.0000 mg | ORAL_CAPSULE | Freq: Every day | ORAL | 0 refills | Status: DC

## 2024-05-11 NOTE — Progress Notes (Signed)
 Eugene Boyd [id number] [birth year] 18 y.o.  Virtual Visit via Video Note  I connected with pt @ on 05/11/24 at  4:30 PM EDT by a video enabled telemedicine application and verified that I am speaking with the correct person using two identifiers.   I discussed the limitations of evaluation and management by telemedicine and the availability of in person appointments. The patient expressed understanding and agreed to proceed.  I discussed the assessment and treatment plan with the patient. The patient was provided an opportunity to ask questions and all were answered. The patient agreed with the plan and demonstrated an understanding of the instructions.   The patient was advised to call back or seek an in-person evaluation if the symptoms worsen or if the condition fails to improve as anticipated.  I provided 15 minutes of non-face-to-face time during this encounter.  The patient was located at home.  The provider was located at [facility].   Reagan Camera, NP   Subjective:   Patient ID:  Eugene Boyd is a 18 y.o. (DOB 12-Oct-2006) male.  Chief Complaint: No chief complaint on file.   HPI Eugene Boyd presents for follow-up of MDD, GAD and ADHD.  Describes mood today as "ok". Pleasant. Mood symptoms - denies depression, anxiety and irritability. Reports stable interest and motivation. Denies panic attacks. Reports some worry, rumination and over thinking. Reports mood is variable. Stating "I feel like I'm doing ok". Feels like the Adderall XR 25mg  works well for him. Taking medications as prescribed.   Energy levels stable. Active, does not have a regular exercise routine.  Enjoys some usual interests and activities. Single. Lives between mother and father. Mother and father divorced. Spending time with family. Appetite adequate. Weight - 185 pounds - 75". Sleep has improved. Averages 5 to 6 hours during work days and longer on days off.  Focus and concentration  stable with Adderall. Completing tasks. Managing aspects of household. High school graduate. Working at [employer]. Denies SI or HI.  Denies AH or VH. Denies any self harm. Denies substance use.    Review of Systems:  Review of Systems  Musculoskeletal:  Negative for gait problem.  Neurological:  Negative for tremors.  Psychiatric/Behavioral:         Please refer to HPI    Medications: I have reviewed the patient's current medications.  Current Outpatient Medications  Medication Sig Dispense Refill   amphetamine -dextroamphetamine  (ADDERALL XR) 25 MG 24 hr capsule Take 1 capsule by mouth daily after breakfast. 30 capsule 0   [START ON 06/08/2024] amphetamine -dextroamphetamine  (ADDERALL XR) 25 MG 24 hr capsule Take 1 capsule by mouth every morning. 30 capsule 0   [START ON 07/06/2024] amphetamine -dextroamphetamine  (ADDERALL XR) 25 MG 24 hr capsule Take 1 capsule by mouth every morning. 10 capsule 0   naproxen  (NAPROSYN ) 500 MG tablet Take 1 tablet (500 mg total) by mouth 2 (two) times daily as needed for moderate pain (pain score 4-6). 30 tablet 0   promethazine -dextromethorphan (PROMETHAZINE -DM) 6.25-15 MG/5ML syrup Take 5 mLs by mouth 4 (four) times daily as needed. 118 mL 0   No current facility-administered medications for this visit.    Medication Side Effects: None  Allergies:  Allergies  Allergen Reactions   Amoxicillin Rash    Past Medical History:  Diagnosis Date   Gastrointestinal complaints    Headache    Obesity     Family History  Problem Relation Age of Onset   GER disease Mother  Asthma Mother    Allergies Mother    Colon polyps Mother    Hiatal hernia Mother    Colitis Maternal Grandmother    Celiac disease Maternal Grandmother    Epilepsy Father     Social History   Socioeconomic History   Marital status: Single    Spouse name: Not on file   Number of children: Not on file   Years of education: Not on file   Highest education level: Not on  file  Occupational History   Not on file  Tobacco Use   Smoking status: Never   Smokeless tobacco: Never  Vaping Use   Vaping status: Never Used  Substance and Sexual Activity   Alcohol use: No   Drug use: No   Sexual activity: Never  Other Topics Concern   Not on file  Social History Narrative    Pt is in grade 10th [employer], virtual. Lives at home with mother and sister.   Social Drivers of Corporate investment banker Strain: Not on file  Food Insecurity: Not on file  Transportation Needs: Not on file  Physical Activity: Not on file  Stress: Not on file  Social Connections: Not on file  Intimate Partner Violence: Not on file    Past Medical History, Surgical history, Social history, and Family history were reviewed and updated as appropriate.   Please see review of systems for further details on the patient's review from today.   Objective:   Physical Exam:  There were no vitals taken for this visit.  Physical Exam Constitutional:      General: He is not in acute distress. Musculoskeletal:        General: No deformity.  Neurological:     Mental Status: He is alert and oriented to person, place, and time.     Coordination: Coordination normal.  Psychiatric:        Attention and Perception: Attention and perception normal. He does not perceive auditory or visual hallucinations.        Mood and Affect: Mood normal. Mood is not anxious or depressed. Affect is not labile, blunt, angry or inappropriate.        Speech: Speech normal.        Behavior: Behavior normal.        Thought Content: Thought content normal. Thought content is not paranoid or delusional. Thought content does not include homicidal or suicidal ideation. Thought content does not include homicidal or suicidal plan.        Cognition and Memory: Cognition and memory normal.        Judgment: Judgment normal.     Comments: Insight intact     Lab Review:     Component Value Date/Time   NA 140  09/15/2017 1212   NA 141 10/30/2014 1123   K 4.5 09/15/2017 1212   K 4.0 10/30/2014 1123   CL 103 09/15/2017 1212   CL 106 10/30/2014 1123   CO2 27 09/15/2017 1212   CO2 28 (H) 10/30/2014 1123   GLUCOSE 84 09/15/2017 1212   GLUCOSE 83 10/30/2014 1123   BUN 10 09/15/2017 1212   BUN 11 10/30/2014 1123   CREATININE 0.61 09/15/2017 1212   CALCIUM 10.2 09/15/2017 1212   CALCIUM 9.4 10/30/2014 1123   PROT 7.3 09/15/2017 1212   PROT 7.9 10/30/2014 1123   ALBUMIN 4.0 10/30/2014 1123   AST 16 09/15/2017 1212   AST 28 10/30/2014 1123   ALT 21 09/15/2017 1212   ALT  33 10/30/2014 1123   ALKPHOS 251 (H) 10/30/2014 1123   BILITOT 0.4 09/15/2017 1212   BILITOT 0.2 10/30/2014 1123       Component Value Date/Time   WBC 6.8 09/15/2017 1212   RBC 4.90 09/15/2017 1212   HGB 13.6 09/15/2017 1212   HCT 40.6 09/15/2017 1212   PLT 355 09/15/2017 1212   MCV 82.9 09/15/2017 1212   MCH 27.8 09/15/2017 1212   MCHC 33.5 09/15/2017 1212   RDW 13.1 09/15/2017 1212   LYMPHSABS 3,448 09/15/2017 1212   EOSABS 143 09/15/2017 1212   BASOSABS 41 09/15/2017 1212    No results found for: "POCLITH", "LITHIUM"   No results found for: "PHENYTOIN", "PHENOBARB", "VALPROATE", "CBMZ"   .res Assessment: Plan:   Plan:  PDMP reviewed   Adderall XR 25mg  daily - will call in 3 months for next set of refills.   Set up with therapist - Marquette Sites  RTC 6 months  15 minutes spent dedicated to the care of this patient on the date of this encounter to include pre-visit review of records, ordering of medication, post visit documentation, and face-to-face time with the patient discussing MDD, GAD and ADHD. Discussed continuing current medication regimen.  Patient advised to contact office with any questions, adverse effects, or acute worsening in signs and symptoms.  Discussed potential benefits, risks, and side effects of stimulants with patient to include increased heart rate, palpitations, insomnia,  increased anxiety, increased irritability, or decreased appetite.  Instructed patient to contact office if experiencing any significant tolerability issues.   Diagnoses and all orders for this visit:  Persistent depressive disorder with atypical features, currently moderate  Attention deficit hyperactivity disorder (ADHD), combined type -     amphetamine -dextroamphetamine  (ADDERALL XR) 25 MG 24 hr capsule; Take 1 capsule by mouth daily after breakfast. -     amphetamine -dextroamphetamine  (ADDERALL XR) 25 MG 24 hr capsule; Take 1 capsule by mouth every morning. -     amphetamine -dextroamphetamine  (ADDERALL XR) 25 MG 24 hr capsule; Take 1 capsule by mouth every morning.  Generalized anxiety disorder     Please see After Visit Summary for patient specific instructions.  No future appointments.   No orders of the defined types were placed in this encounter.     -------------------------------

## 2024-07-11 ENCOUNTER — Telehealth: Payer: Self-pay | Admitting: Adult Health

## 2024-07-11 ENCOUNTER — Other Ambulatory Visit: Payer: Self-pay

## 2024-07-11 DIAGNOSIS — F902 Attention-deficit hyperactivity disorder, combined type: Secondary | ICD-10-CM

## 2024-07-11 MED ORDER — AMPHETAMINE-DEXTROAMPHET ER 25 MG PO CP24: 25.0000 mg | ORAL_CAPSULE | Freq: Every day | ORAL | 0 refills | Status: DC

## 2024-07-11 NOTE — Telephone Encounter (Signed)
 Pt needs RF of Adderall. Only got 10 day supply for July.   [facility] [address]  [place]

## 2024-07-11 NOTE — Telephone Encounter (Signed)
 The 10 day supply was for May. He had 2 no shows and only got enough to get to his appt. LF 6/30 for #30 for Adderall XR 25. Pended

## 2024-07-13 ENCOUNTER — Ambulatory Visit: Attending: Pediatrics

## 2024-07-13 ENCOUNTER — Encounter: Payer: Self-pay | Admitting: Pediatrics

## 2024-07-13 ENCOUNTER — Ambulatory Visit: Admitting: Pediatrics

## 2024-07-13 VITALS — BP 128/74 | HR 71 | Temp 98.4°F | Ht 74.0 in | Wt 168.6 lb

## 2024-07-13 DIAGNOSIS — R000 Tachycardia, unspecified: Secondary | ICD-10-CM | POA: Diagnosis not present

## 2024-07-13 DIAGNOSIS — Z131 Encounter for screening for diabetes mellitus: Secondary | ICD-10-CM

## 2024-07-13 DIAGNOSIS — M545 Low back pain, unspecified: Secondary | ICD-10-CM | POA: Diagnosis not present

## 2024-07-13 DIAGNOSIS — Z7689 Persons encountering health services in other specified circumstances: Secondary | ICD-10-CM | POA: Diagnosis not present

## 2024-07-13 DIAGNOSIS — M546 Pain in thoracic spine: Secondary | ICD-10-CM

## 2024-07-13 DIAGNOSIS — Z13220 Encounter for screening for lipoid disorders: Secondary | ICD-10-CM | POA: Diagnosis not present

## 2024-07-13 DIAGNOSIS — Z1330 Encounter for screening examination for mental health and behavioral disorders, unspecified: Secondary | ICD-10-CM

## 2024-07-13 DIAGNOSIS — G8929 Other chronic pain: Secondary | ICD-10-CM

## 2024-07-13 NOTE — Progress Notes (Signed)
 Establish Care Note  BP 128/74   Pulse 71   Temp 98.4 F (36.9 C) (Oral)   Ht 6' 2 (1.88 m)   Wt 168 lb 9.6 oz (76.5 kg)   SpO2 99%   BMI 21.65 kg/m    Subjective:    Patient ID: Eugene Boyd, male    DOB: Oct 12, 2006, 18 y.o.   MRN: [id number]  HPI: Eugene Boyd is a 18 y.o. male  Chief Complaint  Patient presents with   Establish Care    Off and on back pain , hand numbness sometimes if working    #shoulder blade back pain Has had off and on for a long time No recent trauma or cause of exacerbation Has felt hand numbness during work as well Denies any headaches, vision changes, changes to motor function, sensory changes, abnormal movements/seizures   #tachycardia Fam h/o structural cardiac disease Has had elevated heart rates Currently on ADHD medications No chest pain, sob, lower extremity edema.   Current Outpatient Medications on File Prior to Visit  Medication Sig Dispense Refill   amphetamine -dextroamphetamine  (ADDERALL XR) 25 MG 24 hr capsule Take 1 capsule by mouth daily after breakfast. 30 capsule 0   amphetamine -dextroamphetamine  (ADDERALL XR) 25 MG 24 hr capsule Take 1 capsule by mouth every morning. 30 capsule 0   amphetamine -dextroamphetamine  (ADDERALL XR) 25 MG 24 hr capsule Take 1 capsule by mouth every morning. 10 capsule 0   naproxen  (NAPROSYN ) 500 MG tablet Take 1 tablet (500 mg total) by mouth 2 (two) times daily as needed for moderate pain (pain score 4-6). 30 tablet 0   promethazine -dextromethorphan (PROMETHAZINE -DM) 6.25-15 MG/5ML syrup Take 5 mLs by mouth 4 (four) times daily as needed. 118 mL 0   No current facility-administered medications on file prior to visit.    #HM Will review HM records and updated as needed.  Relevant past medical, surgical, family and social history reviewed and updated as indicated. Interim medical history since our last visit reviewed. Allergies and medications reviewed and updated.  ROS per HPI unless  specifically indicated above     Objective:    BP 128/74   Pulse 71   Temp 98.4 F (36.9 C) (Oral)   Ht 6' 2 (1.88 m)   Wt 168 lb 9.6 oz (76.5 kg)   SpO2 99%   BMI 21.65 kg/m   Wt Readings from Last 3 Encounters:  07/13/24 168 lb 9.6 oz (76.5 kg)  11/19/23 267 lb 3.2 oz (121.2 kg) (>99%, Z= 2.66)*  10/28/20 (!) 267 lb 3.2 oz (121.2 kg) (>99%, Z= 2.95)*   * Growth percentiles are based on CDC (Boys, 2-20 Years) data.     Physical Exam Constitutional:      Appearance: Normal appearance.  Pulmonary:     Effort: Pulmonary effort is normal.  Musculoskeletal:        General: Normal range of motion.  Skin:    Comments: Normal skin color  Neurological:     General: No focal deficit present.     Mental Status: He is alert. Mental status is at baseline.  Psychiatric:        Mood and Affect: Mood normal.        Behavior: Behavior normal.        Thought Content: Thought content normal.         07/13/2024    3:18 PM  Depression screen PHQ 2/9  Decreased Interest 3  Down, Depressed, Hopeless 2  PHQ - 2 Score  5  Altered sleeping 3  Tired, decreased energy 3  Change in appetite 3  Feeling bad or failure about yourself  3  Trouble concentrating 3  Moving slowly or fidgety/restless 2  Suicidal thoughts 0  PHQ-9 Score 22  Difficult doing work/chores Very difficult        07/13/2024    3:19 PM  GAD 7 : Generalized Anxiety Score  Nervous, Anxious, on Edge 3  Control/stop worrying 3  Worry too much - different things 3  Trouble relaxing 3  Restless 3  Easily annoyed or irritable 3  Afraid - awful might happen 2  Total GAD 7 Score 20  Anxiety Difficulty Very difficult       Assessment & Plan:  Assessment & Plan   Chronic midline thoracic back pain Lumbar back pain Chronic issue, no prior imaging. Plan on celebrex for treatment and imaging as below. No red flag sx.  -     DG Thoracic Spine W/Swimmers; Future -     DG Lumbar Spine Complete;  Future  Tachycardia Intermittent issue. Normal HR here. Plan to set up with [facility] and send blood work below. On adderall for ADHD which may be contributing.  -     TSH -     Magnesium -     LONG TERM MONITOR (3-14 DAYS); Future  Diabetes mellitus screening -     Hemoglobin A1c  Lipid screening -     Lipid panel  Encounter to establish care Reviewed available patient record including history, medications, problem list. HM updated as able. Will review and/or request outside records (if applicable) and will fill remaining HM gaps as needed at follow up visit. -     CBC with Differential/Platelet -     Comprehensive metabolic panel with GFR  Encounter for behavioral health screening As part of their intake evaluation, the patient was screened for depression, anxiety.  PHQ9 SCORE 22, GAD7 SCORE 20. Screening results positive for tested conditions. See plan under problem/diagnosis above. Follows with psych.   Follow up plan: Return in about 4 weeks (around 08/10/2024).  Eugene SHAUNNA Nett, MD

## 2024-07-13 NOTE — Patient Instructions (Addendum)
 I sent celebrex 100mg  twice daily to use for back pain if needed  I am sending a holter monitor to your house  [facility] [address] [place],  [place]  [address]  Good to meet you! Welcome to [facility]!  As your primary care doctor, I look forward to working with you to help you reach your health goals.  Please be aware of a couple of logistical items: - If you message me on mychart, it may take me 1-2 business days to get back to you. This is for non-urgent messaging.  - If you require urgent clinical attention, please call the clinic or present to urgent care/emergency room - If you have labs, I typically will send a message about them in 1-2 business days. - I am not here on Mondays, otherwise will be available from Tuesday-Friday during 8a-5pm.

## 2024-07-14 LAB — COMPREHENSIVE METABOLIC PANEL WITH GFR
ALT: 15 IU/L (ref 0–44)
AST: 15 IU/L (ref 0–40)
Albumin: 4.6 g/dL (ref 4.3–5.2)
Alkaline Phosphatase: 49 IU/L — ABNORMAL LOW (ref 51–125)
BUN/Creatinine Ratio: 9 (ref 9–20)
BUN: 7 mg/dL (ref 6–20)
Bilirubin Total: 0.6 mg/dL (ref 0.0–1.2)
CO2: 22 mmol/L (ref 20–29)
Calcium: 9.9 mg/dL (ref 8.7–10.2)
Chloride: 102 mmol/L (ref 96–106)
Creatinine, Ser: 0.82 mg/dL (ref 0.76–1.27)
Globulin, Total: 2.2 g/dL (ref 1.5–4.5)
Glucose: 84 mg/dL (ref 70–99)
Potassium: 4.1 mmol/L (ref 3.5–5.2)
Sodium: 141 mmol/L (ref 134–144)
Total Protein: 6.8 g/dL (ref 6.0–8.5)
eGFR: 129 mL/min/1.73 (ref >59)

## 2024-07-14 LAB — CBC WITH DIFFERENTIAL/PLATELET
Basophils Absolute: 0.1 x10E3/uL (ref 0.0–0.2)
Basos: 1 %
EOS (ABSOLUTE): 0.1 x10E3/uL (ref 0.0–0.4)
Eos: 2 %
Hematocrit: 38.6 % (ref 37.5–51.0)
Hemoglobin: 13 g/dL (ref 13.0–17.7)
Immature Grans (Abs): 0 x10E3/uL (ref 0.0–0.1)
Immature Granulocytes: 0 %
Lymphocytes Absolute: 2.1 x10E3/uL (ref 0.7–3.1)
Lymphs: 48 %
MCH: 30.3 pg (ref 26.6–33.0)
MCHC: 33.7 g/dL (ref 31.5–35.7)
MCV: 90 fL (ref 79–97)
Monocytes Absolute: 0.4 x10E3/uL (ref 0.1–0.9)
Monocytes: 8 %
Neutrophils Absolute: 1.8 x10E3/uL (ref 1.4–7.0)
Neutrophils: 40 %
Platelets: 274 x10E3/uL (ref 150–450)
RBC: 4.29 x10E6/uL (ref 4.14–5.80)
RDW: 12.9 % (ref 11.6–15.4)
WBC: 4.4 x10E3/uL (ref 3.4–10.8)

## 2024-07-14 LAB — MAGNESIUM: Magnesium: 2 mg/dL (ref 1.6–2.3)

## 2024-07-14 LAB — LIPID PANEL
Chol/HDL Ratio: 1.8 ratio (ref 0.0–5.0)
Cholesterol, Total: 131 mg/dL (ref 100–199)
HDL: 72 mg/dL (ref >39)
LDL Chol Calc (NIH): 49 mg/dL (ref 0–99)
Triglycerides: 40 mg/dL (ref 0–149)
VLDL Cholesterol Cal: 10 mg/dL (ref 5–40)

## 2024-07-14 LAB — HEMOGLOBIN A1C
Est. average glucose Bld gHb Est-mCnc: 103 mg/dL
Hgb A1c MFr Bld: 5.2 % (ref 4.8–5.6)

## 2024-07-14 LAB — TSH: TSH: 0.342 u[IU]/mL — ABNORMAL LOW (ref 0.450–4.500)

## 2024-07-18 ENCOUNTER — Ambulatory Visit: Payer: Self-pay | Admitting: Pediatrics

## 2024-07-18 ENCOUNTER — Encounter: Payer: Self-pay | Admitting: Pediatrics

## 2024-08-03 ENCOUNTER — Ambulatory Visit: Admission: EM | Admit: 2024-08-03 | Discharge: 2024-08-03 | Disposition: A | Discharge status: Home / Self Care

## 2024-08-03 DIAGNOSIS — U071 COVID-19: Secondary | ICD-10-CM

## 2024-08-03 NOTE — ED Triage Notes (Signed)
 Patient states that he tested positive for Covid last night. Patient sates that he was exposed 2 days ago. Patient states that he's been having nasal congestion,cough, headache, fatigue x 2 days.

## 2024-08-03 NOTE — Discharge Instructions (Signed)
The recommendations suggest returning to normal activities when, for at least 24 hours, symptoms are improving overall, and if a fever was present, it has been gone without use of a fever-reducing medication.    Once people resume normal activities, they are encouraged to take additional prevention strategies for the next 5 days to curb disease spread, such as taking more steps for cleaner air, enhancing hygiene practices, wearing a well-fitting mask, keeping a distance from others, and/or getting tested for respiratory viruses.

## 2024-08-03 NOTE — ED Provider Notes (Signed)
 [facility]    CSN: [id number] Arrival date & time: 08/03/24  1320      History   Chief Complaint Chief Complaint  Patient presents with   Cough    HPI EDU ON is a 18 y.o. male.   18 year old male patient, Eugene Boyd, presents to urgent care for evaluation of positive at-home COVID test last night.  Patient states that he was exposed 2 days ago and has been having nasal congestion cough headache fatigue for 2 days, requesting work note.  The history is provided by the patient. No language interpreter was used.    Past Medical History:  Diagnosis Date   Gastrointestinal complaints    Headache    Obesity     Patient Active Problem List   Diagnosis Date Noted   Positive self-administered antigen test for COVID-19 08/03/2024   Persistent depressive disorder with atypical features, currently moderate 09/20/2018   Generalized anxiety disorder 09/02/2018   Attention deficit hyperactivity disorder (ADHD), combined type 09/02/2018   IBS (irritable bowel syndrome) 04/04/2018    Past Surgical History:  Procedure Laterality Date   ESOPHAGOGASTRODUODENOSCOPY N/A 01/11/2018   Procedure: ESOPHAGOGASTRODUODENOSCOPY (EGD);  Surgeon: Raiford Ade, MD;  Location: [facility] END[facility]COPY;  Service: Gastroenterology;  Laterality: N/A;   FLEXIBLE SIGMOIDOSCOPY N/A 01/11/2018   Procedure: FLEXIBLE SIGMOIDOSCOPY;  Surgeon: Raiford Ade, MD;  Location: [facility] ENDOSCOPY;  Service: Gastroenterology;  Laterality: N/A;       Home Medications    Prior to Admission medications   Medication Sig Start Date End Date Taking? Authorizing Provider  amitriptyline  (ELAVIL ) 50 MG tablet Take 50 mg by mouth. 02/25/21  Yes [provider]  amphetamine -dextroamphetamine  (ADDERALL XR) 25 MG 24 hr capsule Take 1 capsule by mouth every morning. 06/08/24  Yes Mozingo, Regina Nattalie, NP  linaclotide (LINZESS) 145 MCG CAPS capsule Take 145 mcg by mouth. 02/25/21  Yes [provider]   lubiprostone (AMITIZA) 8 MCG capsule Take 8 mcg by mouth. 03/10/21  Yes [provider]  amphetamine -dextroamphetamine  (ADDERALL XR) 25 MG 24 hr capsule Take 1 capsule by mouth every morning. 07/06/24   Mozingo, Regina Nattalie, NP  amphetamine -dextroamphetamine  (ADDERALL XR) 25 MG 24 hr capsule Take 1 capsule by mouth daily after breakfast. 07/11/24 08/10/24  Mozingo, Regina Nattalie, NP  naproxen  (NAPROSYN ) 500 MG tablet Take 1 tablet (500 mg total) by mouth 2 (two) times daily as needed for moderate pain (pain score 4-6). 11/19/23   Arvis Jolan NOVAK, PA-C    Family History Family History  Problem Relation Age of Onset   GER disease Mother    Asthma Mother    Allergies Mother    Colon polyps Mother    Hiatal hernia Mother    Colitis Maternal Grandmother    Celiac disease Maternal Grandmother    Epilepsy Father     Social History Social History   Tobacco Use   Smoking status: Never   Smokeless tobacco: Never  Vaping Use   Vaping status: Never Used  Substance Use Topics   Alcohol use: No   Drug use: Yes    Types: Marijuana     Allergies   Amoxicillin   Review of Systems Review of Systems  Constitutional:  Positive for fatigue and fever.  HENT:  Positive for congestion.   Respiratory:  Positive for cough. Negative for shortness of breath and wheezing.   Cardiovascular:  Negative for chest pain and palpitations.  Neurological:  Positive for headaches.  All other systems reviewed and are negative.  Physical Exam Triage Vital Signs ED Triage Vitals [08/03/24 1401]  Encounter Vitals Group     BP 129/78     Girls Systolic BP Percentile      Girls Diastolic BP Percentile      Boys Systolic BP Percentile      Boys Diastolic BP Percentile      Pulse Rate 80     Resp 19     Temp 98.4 F (36.9 C)     Temp Source Oral     SpO2 99 %     Weight      Height      Head Circumference      Peak Flow      Pain Score      Pain Loc      Pain Education       Exclude from Growth Chart    No data found.  Updated Vital Signs BP 129/78 (BP Location: Right Arm)   Pulse 80   Temp 98.4 F (36.9 C) (Oral)   Resp 19   SpO2 99%   Visual Acuity Right Eye Distance:   Left Eye Distance:   Bilateral Distance:    Right Eye Near:   Left Eye Near:    Bilateral Near:     Physical Exam Vitals and nursing note reviewed.  Constitutional:      General: He is not in acute distress.    Appearance: He is well-developed. He is not ill-appearing or toxic-appearing.  HENT:     Head: Normocephalic.     Right Ear: Tympanic membrane is retracted.     Left Ear: Tympanic membrane is retracted.     Nose: Mucosal edema and congestion present.     Mouth/Throat:     Mouth: Mucous membranes are moist.     Pharynx: Uvula midline.  Eyes:     General: Lids are normal.     Conjunctiva/sclera: Conjunctivae normal.     Pupils: Pupils are equal, round, and reactive to light.  Cardiovascular:     Rate and Rhythm: Normal rate and regular rhythm.     Heart sounds: Normal heart sounds.  Pulmonary:     Effort: Pulmonary effort is normal. No respiratory distress.     Breath sounds: Normal breath sounds. No decreased breath sounds or wheezing.  Abdominal:     General: There is no distension.     Palpations: Abdomen is soft.  Musculoskeletal:        General: Normal range of motion.     Cervical back: Normal range of motion.  Skin:    General: Skin is warm and dry.     Findings: No rash.  Neurological:     General: No focal deficit present.     Mental Status: He is alert and oriented to person, place, and time.     GCS: GCS eye subscore is 4. GCS verbal subscore is 5. GCS motor subscore is 6.     Cranial Nerves: No cranial nerve deficit.     Sensory: No sensory deficit.  Psychiatric:        Attention and Perception: Attention normal.        Mood and Affect: Mood normal.        Speech: Speech normal.        Behavior: Behavior normal. Behavior is cooperative.       UC Treatments / Results  Labs (all labs ordered are listed, but only abnormal results are displayed) Labs Reviewed - No data to display  EKG   Radiology No results found.  Procedures Procedures (including critical care time)  Medications Ordered in UC Medications - No data to display  Initial Impression / Assessment and Plan / UC Course  I have reviewed the triage vital signs and the nursing notes.  Pertinent labs & imaging results that were available during my care of the patient were reviewed by me and considered in my medical decision making (see chart for details).    Discussed exam findings and plan of care with patient, strict go to ER precautions given.   Patient verbalized understanding to this provider.  Ddx: COVID, viral illness, allergies Final Clinical Impressions(s) / UC Diagnoses   Final diagnoses:  Positive self-administered antigen test for COVID-19     Discharge Instructions       The recommendations suggest returning to normal activities when, for at least 24 hours, symptoms are improving overall, and if a fever was present, it has been gone without use of a fever-reducing medication.  Once people resume normal activities, they are encouraged to take additional prevention strategies for the next 5 days to curb disease spread, such as taking more steps for cleaner air, enhancing hygiene practices, wearing a well-fitting mask, keeping a distance from others, and/or getting tested for respiratory viruses    ED Prescriptions   None    PDMP not reviewed this encounter.   Aminta Loose, NP 08/03/24 1954

## 2024-08-10 ENCOUNTER — Ambulatory Visit: Admitting: Pediatrics

## 2024-08-25 ENCOUNTER — Telehealth: Payer: Self-pay | Admitting: Adult Health

## 2024-08-25 ENCOUNTER — Other Ambulatory Visit: Payer: Self-pay

## 2024-08-25 DIAGNOSIS — F902 Attention-deficit hyperactivity disorder, combined type: Secondary | ICD-10-CM

## 2024-08-25 MED ORDER — AMPHETAMINE-DEXTROAMPHET ER 25 MG PO CP24: 25.0000 mg | ORAL_CAPSULE | ORAL | 0 refills | Status: DC

## 2024-08-25 NOTE — Telephone Encounter (Signed)
 Next visit is 11/16/24. Requesting a refill for generic Vyvanse and Adderall 10 mg called to:  [id number] [place], [place] - [address] [facility] [address] [facility]   Phone: [phone]  Fax: [phone]

## 2024-08-25 NOTE — Telephone Encounter (Signed)
 Pt is not on Vyvanse and not on 10 mg Adderall, is on 25 mg XR. Pended.

## 2024-11-16 ENCOUNTER — Telehealth: Payer: Self-pay | Admitting: Adult Health

## 2024-11-16 ENCOUNTER — Encounter: Payer: Self-pay | Admitting: Adult Health

## 2024-11-16 DIAGNOSIS — Z0389 Encounter for observation for other suspected diseases and conditions ruled out: Secondary | ICD-10-CM

## 2024-11-16 NOTE — Progress Notes (Signed)
 Patient no show appointment. ? ?

## 2024-11-27 ENCOUNTER — Telehealth: Payer: Self-pay | Admitting: Adult Health

## 2024-11-27 ENCOUNTER — Other Ambulatory Visit: Payer: Self-pay

## 2024-11-27 DIAGNOSIS — F902 Attention-deficit hyperactivity disorder, combined type: Secondary | ICD-10-CM

## 2024-11-27 MED ORDER — AMPHETAMINE-DEXTROAMPHET ER 25 MG PO CP24: 25.0000 mg | ORAL_CAPSULE | ORAL | 0 refills | Status: DC

## 2024-11-27 NOTE — Telephone Encounter (Signed)
 Pended enough to appt, was a no show last appt

## 2024-11-27 NOTE — Telephone Encounter (Signed)
 Mom Romero called asking for a refill  for Eugene Boyd's adderall xr 25 mg. Pharmacy has change to  [facility] [address]. Please change it to the primary pharmacy

## 2024-12-13 ENCOUNTER — Encounter: Payer: Self-pay | Admitting: Adult Health

## 2024-12-13 ENCOUNTER — Telehealth: Admitting: Adult Health

## 2024-12-13 DIAGNOSIS — F329 Major depressive disorder, single episode, unspecified: Secondary | ICD-10-CM | POA: Diagnosis not present

## 2024-12-13 DIAGNOSIS — F902 Attention-deficit hyperactivity disorder, combined type: Secondary | ICD-10-CM

## 2024-12-13 DIAGNOSIS — F909 Attention-deficit hyperactivity disorder, unspecified type: Secondary | ICD-10-CM

## 2024-12-13 DIAGNOSIS — F411 Generalized anxiety disorder: Secondary | ICD-10-CM

## 2024-12-13 DIAGNOSIS — F341 Dysthymic disorder: Secondary | ICD-10-CM

## 2024-12-13 MED ORDER — AMPHETAMINE-DEXTROAMPHET ER 25 MG PO CP24: 25.0000 mg | ORAL_CAPSULE | ORAL | 0 refills | Status: AC

## 2024-12-13 NOTE — Progress Notes (Signed)
 Eugene Boyd [id number] 23-Oct-2006 18 y.o.  Virtual Visit via Video Note  I connected with pt @ on 12/13/2024 at  4:00 PM EST by a video enabled telemedicine application and verified that I am speaking with the correct person using two identifiers.   I discussed the limitations of evaluation and management by telemedicine and the availability of in person appointments. The patient expressed understanding and agreed to proceed.  I discussed the assessment and treatment plan with the patient. The patient was provided an opportunity to ask questions and all were answered. The patient agreed with the plan and demonstrated an understanding of the instructions.   The patient was advised to call back or seek an in-person evaluation if the symptoms worsen or if the condition fails to improve as anticipated.  I provided 15 minutes of non-face-to-face time during this encounter.  The patient was located at home.  The provider was located at [facility].   Eugene LOISE Sayers, NP   Subjective:   Patient ID:  Eugene Boyd is a 18 y.o. (DOB [birth year]) male.  Chief Complaint: No chief complaint on file.   HPI Eugene Boyd presents for follow-up of MDD, GAD and ADHD.  Describes mood today as ok. Pleasant. Mood symptoms - denies anxiety and irritability. Reports some depression - nothing bad. Reports lower interest and motivation. Denies panic attacks. Reports some worry, rumination and over thinking. Reports some obsessive thoughts, not acts. Reports mood is variable - it fluctuates. Stating I feel like I'm doing pretty good. Feels like the Adderall XR 25mg  works well for him. Taking medications as prescribed.   Energy levels ok. Active, does not have a regular exercise routine.  Enjoys some usual interests and activities. Single. Lives with father. Mother and father divorced. Spending time with family. Appetite adequate. Weight - 185 pounds - 75. Sleep is consistent.  Averages 5 to 6 hours. Focus and concentration stable with Adderall. Completing tasks. Managing aspects of household. High school graduate. Currently employed. Denies SI or HI.  Denies AH or VH. Denies any self harm. Denies substance use.  Review of Systems:  Review of Systems  Musculoskeletal:  Negative for gait problem.  Neurological:  Negative for tremors.  Psychiatric/Behavioral:         Please refer to HPI    Medications: I have reviewed the patient's current medications.  Current Outpatient Medications  Medication Sig Dispense Refill   amitriptyline  (ELAVIL ) 50 MG tablet Take 50 mg by mouth.     amphetamine -dextroamphetamine  (ADDERALL XR) 25 MG 24 hr capsule Take 1 capsule by mouth every morning. 30 capsule 0   amphetamine -dextroamphetamine  (ADDERALL XR) 25 MG 24 hr capsule Take 1 capsule by mouth every morning. 30 capsule 0   amphetamine -dextroamphetamine  (ADDERALL XR) 25 MG 24 hr capsule Take 1 capsule by mouth every morning. 16 capsule 0   linaclotide (LINZESS) 145 MCG CAPS capsule Take 145 mcg by mouth.     lubiprostone (AMITIZA) 8 MCG capsule Take 8 mcg by mouth.     naproxen  (NAPROSYN ) 500 MG tablet Take 1 tablet (500 mg total) by mouth 2 (two) times daily as needed for moderate pain (pain score 4-6). 30 tablet 0   No current facility-administered medications for this visit.    Medication Side Effects: None  Allergies: Allergies[1]  Past Medical History:  Diagnosis Date   Gastrointestinal complaints    Headache    Obesity     Family History  Problem Relation Age of Onset  GER disease Mother    Asthma Mother    Allergies Mother    Colon polyps Mother    Hiatal hernia Mother    Colitis Maternal Grandmother    Celiac disease Maternal Grandmother    Epilepsy Father     Social History   Socioeconomic History   Marital status: Single    Spouse name: Not on file   Number of children: 0   Years of education: Not on file   Highest education level: Not  on file  Occupational History   Not on file  Tobacco Use   Smoking status: Never   Smokeless tobacco: Never  Vaping Use   Vaping status: Never Used  Substance and Sexual Activity   Alcohol use: No   Drug use: Yes    Types: Marijuana   Sexual activity: Yes  Other Topics Concern   Not on file  Social History Narrative    Pt is in grade 10th [employer], virtual. Lives at home with mother and sister.   Social Drivers of Health   Tobacco Use: Low Risk (11/16/2024)   Patient History    Smoking Tobacco Use: Never    Smokeless Tobacco Use: Never    Passive Exposure: Not on file  Financial Resource Strain: Not on file  Food Insecurity: Not on file  Transportation Needs: Not on file  Physical Activity: Not on file  Stress: Not on file  Social Connections: Not on file  Intimate Partner Violence: Not on file  Depression (PHQ2-9): High Risk (07/13/2024)   Depression (PHQ2-9)    PHQ-2 Score: 22  Alcohol Screen: Not on file  Housing: Not on file  Utilities: Not on file  Health Literacy: Not on file    Past Medical History, Surgical history, Social history, and Family history were reviewed and updated as appropriate.   Please see review of systems for further details on the patient's review from today.   Objective:   Physical Exam:  There were no vitals taken for this visit.  Physical Exam Constitutional:      General: He is not in acute distress. Musculoskeletal:        General: No deformity.  Neurological:     Mental Status: He is alert and oriented to person, place, and time.     Coordination: Coordination normal.  Psychiatric:        Attention and Perception: Attention and perception normal. He does not perceive auditory or visual hallucinations.        Mood and Affect: Mood normal. Mood is not anxious or depressed. Affect is not labile, blunt, angry or inappropriate.        Speech: Speech normal.        Behavior: Behavior normal.        Thought Content: Thought  content normal. Thought content is not paranoid or delusional. Thought content does not include homicidal or suicidal ideation. Thought content does not include homicidal or suicidal plan.        Cognition and Memory: Cognition and memory normal.        Judgment: Judgment normal.     Comments: Insight intact     Lab Review:     Component Value Date/Time   NA 141 07/13/2024 1600   NA 141 10/30/2014 1123   K 4.1 07/13/2024 1600   K 4.0 10/30/2014 1123   CL 102 07/13/2024 1600   CL 106 10/30/2014 1123   CO2 22 07/13/2024 1600   CO2 28 (H) 10/30/2014 1123  GLUCOSE 84 07/13/2024 1600   GLUCOSE 84 09/15/2017 1212   GLUCOSE 83 10/30/2014 1123   BUN 7 07/13/2024 1600   BUN 11 10/30/2014 1123   CREATININE 0.82 07/13/2024 1600   CREATININE 0.61 09/15/2017 1212   CALCIUM 9.9 07/13/2024 1600   CALCIUM 9.4 10/30/2014 1123   PROT 6.8 07/13/2024 1600   PROT 7.9 10/30/2014 1123   ALBUMIN 4.6 07/13/2024 1600   ALBUMIN 4.0 10/30/2014 1123   AST 15 07/13/2024 1600   AST 28 10/30/2014 1123   ALT 15 07/13/2024 1600   ALT 33 10/30/2014 1123   ALKPHOS 49 (L) 07/13/2024 1600   ALKPHOS 251 (H) 10/30/2014 1123   BILITOT 0.6 07/13/2024 1600   BILITOT 0.2 10/30/2014 1123       Component Value Date/Time   WBC 4.4 07/13/2024 1600   WBC 6.8 09/15/2017 1212   RBC 4.29 07/13/2024 1600   RBC 4.90 09/15/2017 1212   HGB 13.0 07/13/2024 1600   HCT 38.6 07/13/2024 1600   PLT 274 07/13/2024 1600   MCV 90 07/13/2024 1600   MCH 30.3 07/13/2024 1600   MCH 27.8 09/15/2017 1212   MCHC 33.7 07/13/2024 1600   MCHC 33.5 09/15/2017 1212   RDW 12.9 07/13/2024 1600   LYMPHSABS 2.1 07/13/2024 1600   EOSABS 0.1 07/13/2024 1600   BASOSABS 0.1 07/13/2024 1600    No results found for: POCLITH, LITHIUM   No results found for: PHENYTOIN, PHENOBARB, VALPROATE, CBMZ   .res Assessment: Plan:    Plan:  PDMP reviewed  Adderall XR 25mg  daily - will call in 3 months for next set of refills.    RTC 3 months  15 minutes spent dedicated to the care of this patient on the date of this encounter to include pre-visit review of records, ordering of medication, post visit documentation, and face-to-face time with the patient discussing MDD, GAD and ADHD. Discussed continuing current medication regimen.  Patient advised to contact office with any questions, adverse effects, or acute worsening in signs and symptoms.  Discussed potential benefits, risks, and side effects of stimulants with patient to include increased heart rate, palpitations, insomnia, increased anxiety, increased irritability, or decreased appetite.  Instructed patient to contact office if experiencing any significant tolerability issues.   There are no diagnoses linked to this encounter.   Please see After Visit Summary for patient specific instructions.  Future Appointments  Date Time Provider Department Center  12/13/2024  4:00 PM Westlyn Glaza Nattalie, NP [facility] None    No orders of the defined types were placed in this encounter.     -------------------------------     [1]  Allergies Allergen Reactions   Amoxicillin Rash
